# Patient Record
Sex: Male | Born: 1990 | Race: Black or African American | Hispanic: No | Smoking: Never smoker
Health system: Southern US, Community
[De-identification: ages and names within clinical notes are randomized; demographics above are authoritative.]

## PROBLEM LIST (undated history)

## (undated) DIAGNOSIS — I10 Essential (primary) hypertension: Secondary | ICD-10-CM

## (undated) DIAGNOSIS — F419 Anxiety disorder, unspecified: Secondary | ICD-10-CM

## (undated) DIAGNOSIS — Z6834 Body mass index (BMI) 34.0-34.9, adult: Secondary | ICD-10-CM

## (undated) HISTORY — PX: ANTERIOR CRUCIATE LIGAMENT REPAIR: SHX115

## (undated) HISTORY — DX: Body mass index (BMI) 34.0-34.9, adult: Z68.34

## (undated) HISTORY — PX: BACK SURGERY: SHX140

---
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
## 2009-03-09 ENCOUNTER — Encounter: Admission: RE | Admit: 2009-03-09 | Discharge: 2009-03-09 | Payer: Self-pay | Admitting: Neurosurgery

## 2009-03-19 ENCOUNTER — Ambulatory Visit (HOSPITAL_COMMUNITY): Admission: RE | Admit: 2009-03-19 | Discharge: 2009-03-19 | Payer: Self-pay | Admitting: Neurosurgery

## 2009-04-14 ENCOUNTER — Emergency Department (HOSPITAL_COMMUNITY): Admission: EM | Admit: 2009-04-14 | Discharge: 2009-04-14 | Payer: Self-pay | Admitting: Emergency Medicine

## 2009-12-20 ENCOUNTER — Emergency Department (HOSPITAL_COMMUNITY): Admission: EM | Admit: 2009-12-20 | Discharge: 2009-12-20 | Payer: Self-pay | Admitting: Emergency Medicine

## 2010-05-28 ENCOUNTER — Emergency Department (HOSPITAL_COMMUNITY): Admission: EM | Admit: 2010-05-28 | Discharge: 2010-05-28 | Payer: Self-pay | Admitting: Emergency Medicine

## 2010-11-18 LAB — RAPID STREP SCREEN (MED CTR MEBANE ONLY): Streptococcus, Group A Screen (Direct): NEGATIVE

## 2010-11-19 LAB — CBC
HCT: 46.5 % (ref 39.0–52.0)
Hemoglobin: 16 g/dL (ref 13.0–17.0)
MCHC: 34.5 g/dL (ref 30.0–36.0)
MCV: 86.7 fL (ref 78.0–100.0)
Platelets: 246 10*3/uL (ref 150–400)
RBC: 5.36 MIL/uL (ref 4.22–5.81)
RDW: 13 % (ref 11.5–15.5)
WBC: 5.6 10*3/uL (ref 4.0–10.5)

## 2010-12-13 ENCOUNTER — Other Ambulatory Visit: Payer: Self-pay | Admitting: Plastic Surgery

## 2010-12-13 DIAGNOSIS — N62 Hypertrophy of breast: Secondary | ICD-10-CM

## 2010-12-21 ENCOUNTER — Ambulatory Visit
Admission: RE | Admit: 2010-12-21 | Discharge: 2010-12-21 | Disposition: A | Discharge status: Home / Self Care | Payer: BC Managed Care – PPO | Source: Ambulatory Visit | Attending: Plastic Surgery | Admitting: Plastic Surgery

## 2010-12-21 DIAGNOSIS — N62 Hypertrophy of breast: Secondary | ICD-10-CM

## 2010-12-27 NOTE — Op Note (Signed)
NAME:  Brian [name], Brian [name] NO.:  [id number]   MEDICAL RECORD NO.:  [id number]          PATIENT TYPE:  OIB   LOCATION:  3528                         FACILITY:  [facility]   PHYSICIAN:  Coletta Memos, M.D.     DATE OF BIRTH:  1990/08/16   DATE OF PROCEDURE:  03/19/2009  DATE OF DISCHARGE:  03/19/2009                               OPERATIVE REPORT   PREOPERATIVE DIAGNOSES:  1. Displaced disk, left L5-S1.  2. Degenerative disk disease, L4-5 and L5-S1.  3. Lumbar radiculopathy.  4. Lumbar spondylosis without myelopathy.   POSTOPERATIVE DIAGNOSES:  1. Displaced disk, left L5-S1.  2. Degenerative disk disease, L4-5 and L5-S1.  3. Lumbar radiculopathy.  4. Lumbar spondylosis without myelopathy.   PROCEDURE:  1. Left L5-S1 diskectomy without laminectomy.  2. Microdissection.   COMPLICATIONS:  None.   SURGEON:  Coletta Memos, MD   ASSISTANT:  Clydene Fake, MD   INDICATIONS:  Brian [name] is an 20 year old young man who has had a 2-  year documented displaced disk at L5-S1 on the left side.  He has dealt  with the pain until recently when it has become so much worse that he  felt he needed to do something else.  He has had an extensive  conservative treatment during that time.  Nevertheless, it has been  ineffectual and he presents today for operative decompression.   OPERATIVE NOTE:  [name] was brought to the operating room intubated  and placed under general anesthetic.  He was rolled prone onto a Wilson  frame and all pressure points were properly padded.  His back was  prepped and he was draped in a sterile fashion.  I infiltrated 20 mL of  0.5% lidocaine with 1:200,000 strength epinephrine into the lumbar  region.  I then opened the skin with a #10 blade and took the incision  down to the thoracolumbar fascia.  Then using #10 blade, I made a  semicircular opening in the thoracolumbar fascia and retracted that  medially.  I exposed lamina of L5 and S1.  I took  an intraoperative x-  ray to confirm my interlaminar location.  I then proceeded to open the  ligamentum flavum using a 15 blade and Kerrison punch.  I exposed the  thecal sac.  I brought the microscope into the operative field and with  microdissection, I retracted thecal sac medially and exposed what was a  very large disk herniation.  With Dr. Doreen Beam assistance, we then  removed the disk material in a progressive fashion.  I did not scrape  the endplates but did make sure to take down what was a central mound of  disk material.  After decompressing the nerve root knowing that disk  material was still left behind, I then irrigated.  I felt that the  decompression was quite good and that there was no pressure on the S1  root.  I then closed wound in layered fashion with Dr. Doreen Beam  assistance reapproximating the thoracolumbar fascia, subcutaneous, and  subcuticular layers.  I used Dermabond for sterile dressing.  ______________________________  Coletta Memos, M.D.     KC/MEDQ  D:  03/19/2009  T:  03/20/2009  Job:  [id number]

## 2010-12-30 NOTE — Op Note (Signed)
NAME:  Brian [name], Brian [name]                           ACCOUNT NO.:  [id number]   MEDICAL RECORD NO.:  [id number]                   PATIENT TYPE:  INP   LOCATION:  6126                                 FACILITY:  [facility]   PHYSICIAN:  Valetta Fuller, M.D.               DATE OF BIRTH:  09/22/1990   DATE OF PROCEDURE:  DATE OF DISCHARGE:  06/27/2003                                 OPERATIVE REPORT   PREOPERATIVE DIAGNOSIS:  Right testicular torsion.   POSTOPERATIVE DIAGNOSIS:  Right testicular torsion.   PROCEDURE:  Scrotal exploration with bilateral scrotal orchidopexy.   SURGEON:  Valetta Fuller, M.D.   ANESTHESIA:  General endotracheal.   INDICATIONS FOR PROCEDURE:  Brian [name] is an otherwise healthy 20 year old African-  American male. Approximately three hours prior to my evaluation of the  patient, he developed the sudden onset of some right testicular pain. He was  at basketball practice at that time but denied any actual injuries. The pain  was quite abrupt and increased in intensity. He felt that there was right  sided scrotal swelling and he had some mild nausea. He presented to the [facility]  Emergency Room where the ER physician felt the clinical exam and history was  consistent with testicular torsion. We came in immediately to access the  patient. He had indeed had sudden onset of right testicular pain. Clinical  exam indicated a very tender indurated right testicle. We felt this  represented an extremely high likelihood of testicular torsion. He did not  feel that the leg with the Doppler or any other type of imaging studies was  really required and was potentially detrimental. We recommended to the  family that immediate scrotal exploration be performed. We explained to them  that if torsion was found we would attempt a detorsion, access the viability  of the testis and hopefully perform bilateral orchiopexies. Mom and dad were  present and appeared to understand these issues and the  rationale for  immediate exploration.   TECHNIQUE AND FINDINGS:  The patient was brought to the operating room where  he had successful induction of general endotracheal anesthesia. He was  placed in a supine position and prepped and draped in the usual manner. A  standard median raphe incision was made in the scrotum. Attention was first  turned to the right hemiscrotum compartment where the tunica vaginalis was  opened. There appeared to be a small amount of reactive straw colored  hydrocele fluid. Upon entering the tunica vaginalis, one could see a very  dusky purple colored testis. Upon removing the testis, there was  approximately 540 degrees of torsion. The testicle was detorsed and  immediately became very pink and viable in appearance. We removed the  appendages from the epididymis and testis and then turned our attention  towards the left hemiscrotum. The right testis was wrapped in saline gauze.  The left scrotal compartment  was opened and that testis was completely as  expected. A three point fixation was performed with some PDS suture. That  testis was then returned to the hemiscrotum. Attention was then turned to  his right testicle which again looked quite viable and pink. It continued to  be a little bit indurated and firm consistent with some venous congestion  but there was no question about testicular viability in my opinion. A three  point fixation  procedure was also done on that side. Spermatic cord blocks were done with  Marcaine. The median raphe was closed with a running Vicryl suture and the  skin was closed with interrupted Vicryl. The patient appeared to tolerate  the procedure well and he was brought to the recovery room in stable  condition.                                               Valetta Fuller, M.D.    DSG/MEDQ  D:  06/26/2003  T:  06/27/2003  Job:  [id number]

## 2010-12-30 NOTE — Op Note (Signed)
NAME:  Brian [name], RADLE NO.:  [id number]   MEDICAL RECORD NO.:  [id number]          PATIENT TYPE:  AMB   LOCATION:  DSC                          FACILITY:  [facility]   PHYSICIAN:  Loreta Ave, M.D. DATE OF BIRTH:  03-27-1991   DATE OF PROCEDURE:  03/22/2006  DATE OF DISCHARGE:                                 OPERATIVE REPORT   POSTOPERATIVE DIAGNOSIS:  Right knee lateral meniscus tear.   POSTOPERATIVE DIAGNOSIS:  Right knee lateral meniscus tear.   OPERATIVE PROCEDURE:  Right knee examination under anesthesia, arthroscopy  with extensive lateral meniscectomy.   SURGEON:  Loreta Ave, M.D.   ASSISTANT:  Genene Churn. Denton Meek.   ANESTHESIA:  General.   BLOOD LOSS:  Minimal.   TOURNIQUET:  Not employed.   SPECIMENS:  None.   CULTURES:  None.   COMPLICATIONS:  None.   DRESSINGS:  Soft compressive.   DESCRIPTION OF PROCEDURE:  Patient brought to the operating room and after  adequate anesthesia had been obtained, both knees examined.  On the right  full motion and stable ligaments, negative Lachman, negative drawer.  Soft  tissue swelling almost looking like a meniscal cyst at the lateral meniscus.  Positive McMurray.  Tourniquet and leg holder applied.  Leg prepped and  draped in the usual sterile fashion.  Three portals created, one  superolateral, one either medial and lateral parapatellar.  __________  inspected.  Good patellofemoral tracking.  Articular cartridge intact  throughout.  Cruciate ligaments intact.  Medial meniscus, medial compartment  looked good without an appreciable tear.  The lateral meniscus marked  complex tearing, two radial tears.  One at the junction of the medial and  anterior third and the other at the junction of the medial and posterior  third.  Marked complex tearing at both sites.  Not a reputable lesions.  Most of the anterior half and part of the posterior half removed to  facilitate removal of all of the marked  complex tearing.  Able to establish  some of the posterior third.  Hemostasis with cautery, as the tear went out  towards the vascular structures at the margin.  In completion, all  exams  showed the  fragments removed.  Instruments were further removed.  Portals and knee  injected with Marcaine.  Portals closed with 4-0 nylon.  Sterile compressive  dressing applied.  Anesthesia reversed.  Brought to recovery room.  Tolerated the surgery well, no complications.      Loreta Ave, M.D.  Electronically Signed     DFM/MEDQ  D:  03/22/2006  T:  03/23/2006  Job:  [id number]

## 2011-02-05 ENCOUNTER — Emergency Department (HOSPITAL_COMMUNITY)
Admission: EM | Admit: 2011-02-05 | Discharge: 2011-02-05 | Disposition: A | Discharge status: Home / Self Care | Payer: Worker's Compensation | Attending: Emergency Medicine | Admitting: Emergency Medicine

## 2011-02-05 DIAGNOSIS — M79609 Pain in unspecified limb: Secondary | ICD-10-CM | POA: Insufficient documentation

## 2011-02-05 DIAGNOSIS — S61209A Unspecified open wound of unspecified finger without damage to nail, initial encounter: Secondary | ICD-10-CM | POA: Insufficient documentation

## 2011-02-05 DIAGNOSIS — W268XXA Contact with other sharp object(s), not elsewhere classified, initial encounter: Secondary | ICD-10-CM | POA: Insufficient documentation

## 2011-02-07 ENCOUNTER — Emergency Department (HOSPITAL_COMMUNITY)
Admission: EM | Admit: 2011-02-07 | Discharge: 2011-02-07 | Disposition: A | Discharge status: Home / Self Care | Payer: Worker's Compensation | Attending: Emergency Medicine | Admitting: Emergency Medicine

## 2011-02-07 ENCOUNTER — Inpatient Hospital Stay (INDEPENDENT_AMBULATORY_CARE_PROVIDER_SITE_OTHER)
Admission: RE | Admit: 2011-02-07 | Discharge: 2011-02-07 | Disposition: A | Discharge status: Home / Self Care | Payer: Worker's Compensation | Source: Ambulatory Visit | Attending: Emergency Medicine | Admitting: Emergency Medicine

## 2011-02-07 ENCOUNTER — Ambulatory Visit (INDEPENDENT_AMBULATORY_CARE_PROVIDER_SITE_OTHER): Payer: BC Managed Care – PPO

## 2011-02-07 DIAGNOSIS — Z09 Encounter for follow-up examination after completed treatment for conditions other than malignant neoplasm: Secondary | ICD-10-CM | POA: Insufficient documentation

## 2011-02-07 DIAGNOSIS — M79609 Pain in unspecified limb: Secondary | ICD-10-CM | POA: Insufficient documentation

## 2011-02-07 DIAGNOSIS — M7989 Other specified soft tissue disorders: Secondary | ICD-10-CM | POA: Insufficient documentation

## 2011-02-07 DIAGNOSIS — S61209A Unspecified open wound of unspecified finger without damage to nail, initial encounter: Secondary | ICD-10-CM

## 2011-02-09 NOTE — Consult Note (Signed)
  NAME:  [name], BAIL NO.:  [id number]  MEDICAL RECORD NO.:  [id number]  LOCATION:  URG                          FACILITY:  [facility]  PHYSICIAN:  Dionne Ano. Meghan Tiemann, M.D.DATE OF BIRTH:  [birth year]  DATE OF CONSULTATION: DATE OF DISCHARGE:                                CONSULTATION   I had pleasure to see Brian [name] at 10 p.m. in the [facility] Emergency Room for evaluation of his hand.  The patient states he has had significant pain after a duct work injury while on the job at [employer].  The patient cut his hand.  He was given a tetanus shot. Laceration was sutured.  He presented for evaluation today in emergent care.  There was question of infection and I was asked see him.  He is 20 years of age, here with his mother, alert and oriented.  ALLERGIES:  AUGMENTIN.  MEDICATIONS:  None.  PAST SURGICAL HISTORY:  Testicular torsion surgery, ACL surgery, back surgery, thumb surgery.  PAST MEDICAL HISTORY:  None.  He is a nonsmoker.  PHYSICAL EXAMINATION:  GENERAL:  On exam, pleasant male, alert and oriented, no distress. VITAL SIGNS:  The patient has full sensation to the elbow, wrist and forearm.  Right index finger has a laceration of the DIP joint.  The FDP is intact.  There is no gross Kanavel signs.  He has some mild tenderness over the A1 pulley where the injection was performed but does not have any evidence of instability, ascending infection, cellulitis or purulent flexor tenosynovitis. HEENT:  Within normal limits. CHEST:  Clear. ABDOMEN:  Nontender. EXTREMITIES:  Lower extremity examination is benign.  I have reviewed this in length and findings.  IMPRESSION:  Status post laceration, sutured and without signs of ascending infection, now greater than 48 hours out.  PLAN:  I will place him on Keflex as well as Bactrim, Keflex 500 mg one p.o. q.i.d. x7 days, Bactrim DS one p.o. b.i.d. x14 days and I am going to have him have Vicodin for pain and  continue on out of work status.  I have asked him to call the office to that I can see him in 48 hours for wound check.  If he has any problems, I have given him my card and asked him to notify me immediately.  It has been absolute pleasure to see him today and treat him.  We look forward to be participating in his postop recovery.     Dionne Ano. Amanda Pea, M.D.     Nash Mantis  D:  02/07/2011  T:  02/07/2011  Job:  [id number]  Electronically Signed by Dominica Severin M.D. on 02/09/2011 81:19:14 PM

## 2011-10-13 ENCOUNTER — Other Ambulatory Visit: Payer: Self-pay | Admitting: Neurosurgery

## 2011-10-13 DIAGNOSIS — M549 Dorsalgia, unspecified: Secondary | ICD-10-CM

## 2011-10-20 ENCOUNTER — Ambulatory Visit
Admission: RE | Admit: 2011-10-20 | Discharge: 2011-10-20 | Disposition: A | Discharge status: Home / Self Care | Payer: BC Managed Care – PPO | Source: Ambulatory Visit | Attending: Neurosurgery | Admitting: Neurosurgery

## 2011-10-20 DIAGNOSIS — M549 Dorsalgia, unspecified: Secondary | ICD-10-CM

## 2011-10-20 MED ORDER — GADOBENATE DIMEGLUMINE 529 MG/ML IV SOLN
20.0000 mL | Freq: Once | INTRAVENOUS | Status: AC | PRN
  Administered 2011-10-20: 20 mL via INTRAVENOUS

## 2011-11-01 ENCOUNTER — Ambulatory Visit (INDEPENDENT_AMBULATORY_CARE_PROVIDER_SITE_OTHER): Payer: BC Managed Care – PPO | Admitting: Family Medicine

## 2011-11-01 VITALS — BP 121/84 | HR 79 | Temp 98.0°F | Resp 16 | Ht 72.0 in | Wt 215.0 lb

## 2011-11-01 DIAGNOSIS — R1110 Vomiting, unspecified: Secondary | ICD-10-CM

## 2011-11-01 DIAGNOSIS — R112 Nausea with vomiting, unspecified: Secondary | ICD-10-CM

## 2011-11-01 DIAGNOSIS — M519 Unspecified thoracic, thoracolumbar and lumbosacral intervertebral disc disorder: Secondary | ICD-10-CM | POA: Insufficient documentation

## 2011-11-01 LAB — POCT CBC
Granulocyte percent: 85.1 %G — AB (ref 37–80)
HCT, POC: 46 % (ref 43.5–53.7)
Hemoglobin: 16 g/dL (ref 14.1–18.1)
Lymph, poc: 0.9 (ref 0.6–3.4)
MCH, POC: 30.7 pg (ref 27–31.2)
MCHC: 34.8 g/dL (ref 31.8–35.4)
MCV: 88.2 fL (ref 80–97)
MID (cbc): 0.6 (ref 0–0.9)
MPV: 10.3 fL (ref 0–99.8)
POC Granulocyte: 8.4 — AB (ref 2–6.9)
POC LYMPH PERCENT: 8.9 %L — AB (ref 10–50)
POC MID %: 6 %M (ref 0–12)
Platelet Count, POC: 215 10*3/uL (ref 142–424)
RBC: 5.21 M/uL (ref 4.69–6.13)
RDW, POC: 13.8 %
WBC: 9.9 10*3/uL (ref 4.6–10.2)

## 2011-11-01 MED ORDER — PROMETHAZINE HCL 25 MG/ML IJ SOLN
25.0000 mg | Freq: Four times a day (QID) | INTRAMUSCULAR | Status: DC | PRN
  Administered 2011-11-01: 25 mg via INTRAMUSCULAR

## 2011-11-01 MED ORDER — ONDANSETRON 4 MG PO TBDP
4.0000 mg | ORAL_TABLET | Freq: Once | ORAL | Status: AC
  Administered 2011-11-01: 4 mg via ORAL

## 2011-11-01 MED ORDER — ONDANSETRON 8 MG PO TBDP: 8.0000 mg | ORAL_TABLET | Freq: Three times a day (TID) | ORAL | Status: AC | PRN

## 2011-11-01 MED ORDER — SODIUM CHLORIDE 0.9 % IV SOLN: INTRAVENOUS | Status: DC

## 2011-11-01 NOTE — Progress Notes (Signed)
  Subjective:    Patient ID: Brian [name], male    DOB: 06-05-1991, 21 y.o.   MRN: [id number]  HPI    Review of Systems     Objective:   Physical Exam    1545:  IV 20 gauge placed in Right antecubital space and NS hung a wide open rate.  Blood drawn and sent to lab.  Lindaann Slough, PA-C    Assessment & Plan:

## 2011-11-01 NOTE — Progress Notes (Signed)
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  Complains of sharp stomach cramps as well.    O:  Appears acutely ill and fatigued, lightheaded when standing. HEENT:  No icterus, normal tm's, dry mm, no erythema in throat, no epistaxis Neck: supple no adenopathy Chest:  Clear Heart: rapid, no murmur Abdomen:  Hyperactive BS, mild diffuse tenderness with deep palpation, no guarding or rebound, no HSM Skin:  Cool extremities, dry, no rashes Neuro:  Normal mental status, normal cranial nerves, moves 4 extremities  BP 142/82  P 73 lying BP 143/91 P 83 standing.  A:  Early dehydration, ongoing fluid losses

## 2011-11-01 NOTE — Patient Instructions (Signed)
Nausea and Vomiting  Nausea is a sick feeling that often comes before throwing up (vomiting). Vomiting is a reflex where stomach contents come out of your mouth. Vomiting can cause severe loss of body fluids (dehydration). Children and elderly adults can become dehydrated quickly, especially if they also have diarrhea. Nausea and vomiting are symptoms of a condition or disease. It is important to find the cause of your symptoms.  CAUSES    Direct irritation of the stomach lining. This irritation can result from increased acid production (gastroesophageal reflux disease), infection, food poisoning, taking certain medicines (such as nonsteroidal anti-inflammatory drugs), alcohol use, or tobacco use.   Signals from the brain.These signals could be caused by a headache, heat exposure, an inner ear disturbance, increased pressure in the brain from injury, infection, a tumor, or a concussion, pain, emotional stimulus, or metabolic problems.   An obstruction in the gastrointestinal tract (bowel obstruction).   Illnesses such as diabetes, hepatitis, gallbladder problems, appendicitis, kidney problems, cancer, sepsis, atypical symptoms of a heart attack, or eating disorders.   Medical treatments such as chemotherapy and radiation.   Receiving medicine that makes you sleep (general anesthetic) during surgery.  DIAGNOSIS  Your caregiver may ask for tests to be done if the problems do not improve after a few days. Tests may also be done if symptoms are severe or if the reason for the nausea and vomiting is not clear. Tests may include:   Urine tests.   Blood tests.   Stool tests.   Cultures (to look for evidence of infection).   X-rays or other imaging studies.  Test results can help your caregiver make decisions about treatment or the need for additional tests.  TREATMENT  You need to stay well hydrated. Drink frequently but in small amounts.You may wish to drink water, sports drinks, clear broth, or eat frozen  ice pops or gelatin dessert to help stay hydrated.When you eat, eating slowly may help prevent nausea.There are also some antinausea medicines that may help prevent nausea.  HOME CARE INSTRUCTIONS    Take all medicine as directed by your caregiver.   If you do not have an appetite, do not force yourself to eat. However, you must continue to drink fluids.   If you have an appetite, eat a normal diet unless your caregiver tells you differently.   Eat a variety of complex carbohydrates (rice, wheat, potatoes, bread), lean meats, yogurt, fruits, and vegetables.   Avoid high-fat foods because they are more difficult to digest.   Drink enough water and fluids to keep your urine clear or pale yellow.   If you are dehydrated, ask your caregiver for specific rehydration instructions. Signs of dehydration may include:   Severe thirst.   Dry lips and mouth.   Dizziness.   Dark urine.   Decreasing urine frequency and amount.   Confusion.   Rapid breathing or pulse.  SEEK IMMEDIATE MEDICAL CARE IF:    You have blood or brown flecks (like coffee grounds) in your vomit.   You have black or bloody stools.   You have a severe headache or stiff neck.   You are confused.   You have severe abdominal pain.   You have chest pain or trouble breathing.   You do not urinate at least once every 8 hours.   You develop cold or clammy skin.   You continue to vomit for longer than 24 to 48 hours.   You have a fever.  MAKE SURE YOU:      Understand these instructions.   Will watch your condition.   Will get help right away if you are not doing well or get worse.  Document Released: 07/31/2005 Document Revised: 07/20/2011 Document Reviewed: 12/28/2010  Exit[employer] Patient Information 2012 [employer].

## 2011-11-02 LAB — COMPREHENSIVE METABOLIC PANEL
ALT: 41 U/L (ref 0–53)
AST: 89 U/L — ABNORMAL HIGH (ref 0–37)
Albumin: 5.2 g/dL (ref 3.5–5.2)
Alkaline Phosphatase: 91 U/L (ref 39–117)
BUN: 11 mg/dL (ref 6–23)
CO2: 26 mEq/L (ref 19–32)
Calcium: 9.8 mg/dL (ref 8.4–10.5)
Chloride: 104 mEq/L (ref 96–112)
Creat: 0.91 mg/dL (ref 0.50–1.35)
Glucose, Bld: 84 mg/dL (ref 70–99)
Potassium: 4.4 mEq/L (ref 3.5–5.3)
Sodium: 141 mEq/L (ref 135–145)
Total Bilirubin: 0.7 mg/dL (ref 0.3–1.2)
Total Protein: 7.7 g/dL (ref 6.0–8.3)

## 2011-12-03 ENCOUNTER — Ambulatory Visit (INDEPENDENT_AMBULATORY_CARE_PROVIDER_SITE_OTHER): Payer: BC Managed Care – PPO | Admitting: Family Medicine

## 2011-12-03 ENCOUNTER — Encounter: Payer: Self-pay | Admitting: Physician Assistant

## 2011-12-03 ENCOUNTER — Encounter: Payer: Self-pay | Admitting: Family Medicine

## 2011-12-03 VITALS — BP 129/85 | HR 91 | Temp 98.0°F | Resp 12 | Ht 74.0 in | Wt 215.0 lb

## 2011-12-03 DIAGNOSIS — G8918 Other acute postprocedural pain: Secondary | ICD-10-CM

## 2011-12-03 DIAGNOSIS — M519 Unspecified thoracic, thoracolumbar and lumbosacral intervertebral disc disorder: Secondary | ICD-10-CM

## 2011-12-03 DIAGNOSIS — M549 Dorsalgia, unspecified: Secondary | ICD-10-CM

## 2011-12-03 LAB — POCT CBC
Granulocyte percent: 62.3 %G (ref 37–80)
HCT, POC: 47.4 % (ref 43.5–53.7)
Hemoglobin: 15.5 g/dL (ref 14.1–18.1)
Lymph, poc: 2.5 (ref 0.6–3.4)
MCH, POC: 29 pg (ref 27–31.2)
MCHC: 32.7 g/dL (ref 31.8–35.4)
MCV: 88.7 fL (ref 80–97)
MID (cbc): 0.5 (ref 0–0.9)
MPV: 9.5 fL (ref 0–99.8)
POC Granulocyte: 5 (ref 2–6.9)
POC LYMPH PERCENT: 31.4 %L (ref 10–50)
POC MID %: 6.3 %M (ref 0–12)
Platelet Count, POC: 330 10*3/uL (ref 142–424)
RBC: 5.34 M/uL (ref 4.69–6.13)
RDW, POC: 13.4 %
WBC: 8 10*3/uL (ref 4.6–10.2)

## 2011-12-03 MED ORDER — OXYCODONE-ACETAMINOPHEN 5-325 MG PO TABS: 1.0000 | ORAL_TABLET | Freq: Four times a day (QID) | ORAL | Status: AC | PRN

## 2011-12-03 NOTE — Patient Instructions (Addendum)
Normal CBC. He needs to follow up with her back surgeon tomorrow. Take Ibuprofen 800 mg 3 times daily,  Percocet 5 (20) for severe pain.

## 2011-12-03 NOTE — Progress Notes (Signed)
Subjective: 21 year old man who goes to [employer]. He recently had his second disc surgeries. The first one was when he was about 21 years old. At this time he had a right herniated disc at L4-5. The surgeon was Dr. Barbaraann Barthel. He denied day since his surgery he continues to have a great deal of pain in his back and leg. He saw his doctor earlier in the week. A CBC was done at that time but he does not know the result of it. He is concerned about the possibility of infection causing his pain to be worse. His girlfriend, who is a Engineer, civil (consulting), has dressed him with a wet to dry dressing. He is taking hydrocodone 5/325 for pain.  Objective: Recent surgical wound down his midline of the low back. The wound looks clean and dry. I do not see any pus coming out of it. He is tender in the low back. Straight leg raising test is positive at about 10-15 on the right and 20-25 on the left he has spasms of pain.  Assessment: Lumbar disc disease, with recent surgery and postop pain.  Plan: Check CBC. It is markedly elevated would need to contact his doctor, otherwise he will need to communicate with them in the morning.  Results for orders placed in visit on 12/03/11  POCT CBC      Component Value Range   WBC 8.0  4.6 - 10.2 (K/uL)   Lymph, poc 2.5  0.6 - 3.4    POC LYMPH PERCENT 31.4  10 - 50 (%L)   MID (cbc) 0.5  0 - 0.9    POC MID % 6.3  0 - 12 (%M)   POC Granulocyte 5.0  2 - 6.9    Granulocyte percent 62.3  37 - 80 (%G)   RBC 5.34  4.69 - 6.13 (M/uL)   Hemoglobin 15.5  14.1 - 18.1 (g/dL)   HCT, POC 96.0  45.4 - 53.7 (%)   MCV 88.7  80 - 97 (fL)   MCH, POC 29.0  27 - 31.2 (pg)   MCHC 32.7  31.8 - 35.4 (g/dL)   RDW, POC 09.8     Platelet Count, POC 330  142 - 424 (K/uL)   MPV 9.5  0 - 99.8 (fL)   Back pain postop  Follow up with his back surgeon

## 2012-01-06 ENCOUNTER — Emergency Department (HOSPITAL_COMMUNITY)
Admission: EM | Admit: 2012-01-06 | Discharge: 2012-01-06 | Disposition: A | Discharge status: Home / Self Care | Payer: BC Managed Care – PPO | Source: Home / Self Care | Attending: Emergency Medicine | Admitting: Emergency Medicine

## 2012-01-06 ENCOUNTER — Encounter (HOSPITAL_COMMUNITY): Payer: Self-pay | Admitting: *Deleted

## 2012-01-06 ENCOUNTER — Emergency Department (INDEPENDENT_AMBULATORY_CARE_PROVIDER_SITE_OTHER): Payer: BC Managed Care – PPO

## 2012-01-06 DIAGNOSIS — J40 Bronchitis, not specified as acute or chronic: Secondary | ICD-10-CM

## 2012-01-06 HISTORY — DX: Anxiety disorder, unspecified: F41.9

## 2012-01-06 MED ORDER — PREDNISONE 20 MG PO TABS: 40.0000 mg | ORAL_TABLET | Freq: Every day | ORAL | Status: DC

## 2012-01-06 MED ORDER — DOXYCYCLINE HYCLATE 100 MG PO CAPS: 100.0000 mg | ORAL_CAPSULE | Freq: Two times a day (BID) | ORAL | Status: DC

## 2012-01-06 MED ORDER — ALBUTEROL SULFATE HFA 108 (90 BASE) MCG/ACT IN AERS: 1.0000 | INHALATION_SPRAY | Freq: Four times a day (QID) | RESPIRATORY_TRACT | Status: DC | PRN

## 2012-01-06 NOTE — ED Notes (Addendum)
Pt with c/o cough/congestion/body aches x 3 days - taking otc meds without relief - fever 3 days ago

## 2012-01-06 NOTE — ED Provider Notes (Signed)
History     CSN: [id number]  Arrival date & time 01/06/12  1308   First MD Initiated Contact with Patient 01/06/12 1403      Chief Complaint  Patient presents with  . Cough  . Nasal Congestion    (Consider location/radiation/quality/duration/timing/severity/associated sxs/prior treatment) HPI Comments: Its been about 3 days, with a very strong wet cough and phlegm,  congested of my sinuses and my chest", did have some fevers, mostly the first 2 days, that has gotten better, just feels like im not breathing well..."  Patient is a 21 y.o. male presenting with cough. The history is provided by the patient.  Cough This is a new problem. The current episode started more than 2 days ago. The problem occurs constantly. The cough is productive of sputum. Associated symptoms include rhinorrhea and shortness of breath. Pertinent negatives include no chest pain, no chills, no ear pain, no wheezing and no eye redness. The treatment provided no relief. He is not a smoker. His past medical history does not include asthma.    Past Medical History  Diagnosis Date  . Anxiety     Past Surgical History  Procedure Date  . Anterior cruciate ligament repair   . Back surgery     History reviewed. No pertinent family history.  History  Substance Use Topics  . Smoking status: Never Smoker   . Smokeless tobacco: Not on file  . Alcohol Use: Yes      Review of Systems  Constitutional: Positive for fever and activity change. Negative for chills.  HENT: Positive for congestion and rhinorrhea. Negative for hearing loss and ear pain.   Eyes: Negative for redness.  Respiratory: Positive for cough and shortness of breath. Negative for chest tightness, wheezing and stridor.   Cardiovascular: Negative for chest pain.  Skin: Negative for rash.    Allergies  Review of patient's allergies indicates no known allergies.  Home Medications   Current Outpatient Rx  Name Route Sig Dispense Refill  .  DEXTROMETHORPHAN POLISTIREX ER 30 MG/5ML PO LQCR Oral Take 60 mg by mouth as needed.    Marland Kitchen DIAZEPAM 10 MG PO TABS Oral Take 10 mg by mouth every 8 (eight) hours as needed.    Marland Kitchen DIPHENHYDRAMINE HCL 25 MG PO TABS Oral Take 25 mg by mouth every 6 (six) hours as needed.    . ALBUTEROL SULFATE HFA 108 (90 BASE) MCG/ACT IN AERS Inhalation Inhale 1-2 puffs into the lungs every 6 (six) hours as needed for wheezing or shortness of breath. 1 Inhaler 0  . DOXYCYCLINE HYCLATE 100 MG PO CAPS Oral Take 1 capsule (100 mg total) by mouth 2 (two) times daily. 20 capsule 0  . HYDROCODONE-ACETAMINOPHEN 5-500 MG PO CAPS Oral Take 1 capsule by mouth every 6 (six) hours as needed.    Marland Kitchen PREDNISONE 20 MG PO TABS Oral Take 2 tablets (40 mg total) by mouth daily. 2 tablets daily for 5 days 10 tablet 0  . TRAMADOL HCL 50 MG PO TABS Oral Take 50 mg by mouth every 6 (six) hours as needed.      BP 137/103  Pulse 94  Temp(Src) 98.3 F (36.8 C) (Oral)  Resp 18  SpO2 99%  Physical Exam  Nursing note and vitals reviewed. Constitutional: He appears well-developed and well-nourished.  HENT:  Head: Normocephalic.  Mouth/Throat: Uvula is midline. Posterior oropharyngeal erythema present. No oropharyngeal exudate.  Eyes: Conjunctivae are normal.  Neck: Neck supple. No JVD present.  Cardiovascular: Normal rate and  regular rhythm.   Pulmonary/Chest: Effort normal. No accessory muscle usage. Not tachypneic. No respiratory distress. He has decreased breath sounds. He has no wheezes. He has rhonchi. He has no rales.  Lymphadenopathy:    He has no cervical adenopathy.    ED Course  Procedures (including critical care time)  Labs Reviewed - No data to display Dg Chest 2 View  01/06/2012  *RADIOLOGY REPORT*  Clinical Data: Cough, nasal congestion.  CHEST - 2 VIEW  Comparison: 12/20/2009  Findings: Cardiomediastinal silhouette is within normal limits. The lungs are free of focal consolidations and pleural effusions. Bony  structures have a normal appearance.  IMPRESSION: Negative exam.  Original Report Authenticated By: Patterson Hammersmith, M.D.     1. Bronchitis       MDM  Bronchitis, patient encouraged to continue taking OTC, decongestant and antihistamine.Jimmie Molly, MD 01/06/12 [phone]

## 2012-01-08 ENCOUNTER — Emergency Department (HOSPITAL_COMMUNITY)
Admission: EM | Admit: 2012-01-08 | Discharge: 2012-01-08 | Disposition: A | Discharge status: Home / Self Care | Payer: BC Managed Care – PPO | Attending: Emergency Medicine | Admitting: Emergency Medicine

## 2012-01-08 ENCOUNTER — Encounter (HOSPITAL_COMMUNITY): Payer: Self-pay | Admitting: Emergency Medicine

## 2012-01-08 DIAGNOSIS — J069 Acute upper respiratory infection, unspecified: Secondary | ICD-10-CM | POA: Insufficient documentation

## 2012-01-08 MED ORDER — GUAIFENESIN-CODEINE 100-10 MG/5ML PO SYRP: 5.0000 mL | ORAL_SOLUTION | Freq: Three times a day (TID) | ORAL | Status: AC | PRN

## 2012-01-08 NOTE — ED Provider Notes (Signed)
Medical screening examination/treatment/procedure(s) were performed by non-physician practitioner and as supervising physician I was immediately available for consultation/collaboration.   Gwyneth Sprout, MD 01/08/12 2141

## 2012-01-08 NOTE — Discharge Instructions (Signed)
Brian [name] you can add claritin to your meds.  Also use the inhaler 4 times a day (no more).  Use tylenol for aches and pains.  Continue the antibiotics and prednisone.  The cough med will make you sleepy so take it at night.  Follow up with your pcp if not better in a couple days or return for n/v/fever.    Antibiotic Nonuse  Your caregiver felt that the infection or problem was not one that would be helped with an antibiotic. Infections may be caused by viruses or bacteria. Only a caregiver can tell which one of these is the likely cause of an illness. A cold is the most common cause of infection in both adults and children. A cold is a virus. Antibiotic treatment will have no effect on a viral infection. Viruses can lead to many lost days of work caring for sick children and many missed days of school. Children may catch as many as 10 "colds" or "flus" per year during which they can be tearful, cranky, and uncomfortable. The goal of treating a virus is aimed at keeping the ill person comfortable. Antibiotics are medications used to help the body fight bacterial infections. There are relatively few types of bacteria that cause infections but there are hundreds of viruses. While both viruses and bacteria cause infection they are very different types of germs. A viral infection will typically go away by itself within 7 to 10 days. Bacterial infections may spread or get worse without antibiotic treatment. Examples of bacterial infections are:  Sore throats (like strep throat or tonsillitis).   Infection in the lung (pneumonia).   Ear and skin infections.  Examples of viral infections are:  Colds or flus.   Most coughs and bronchitis.   Sore throats not caused by Strep.   Runny noses.  It is often best not to take an antibiotic when a viral infection is the cause of the problem. Antibiotics can kill off the helpful bacteria that we have inside our body and allow harmful bacteria to start growing.  Antibiotics can cause side effects such as allergies, nausea, and diarrhea without helping to improve the symptoms of the viral infection. Additionally, repeated uses of antibiotics can cause bacteria inside of our body to become resistant. That resistance can be passed onto harmful bacterial. The next time you have an infection it may be harder to treat if antibiotics are used when they are not needed. Not treating with antibiotics allows our own immune system to develop and take care of infections more efficiently. Also, antibiotics will work better for [place] when they are prescribed for bacterial infections. Treatments for a child that is ill may include:  Give extra fluids throughout the day to stay hydrated.   Get plenty of rest.   Only give your child over-the-counter or prescription medicines for pain, discomfort, or fever as directed by your caregiver.   The use of a cool mist humidifier may help stuffy noses.   Cold medications if suggested by your caregiver.  Your caregiver may decide to start you on an antibiotic if:  The problem you were seen for today continues for a longer length of time than expected.   You develop a secondary bacterial infection.  SEEK MEDICAL CARE IF:  Fever lasts longer than 5 days.   Symptoms continue to get worse after 5 to 7 days or become severe.   Difficulty in breathing develops.   Signs of dehydration develop (poor drinking, rare urinating, dark  colored urine).   Changes in behavior or worsening tiredness (listlessness or lethargy).  Document Released: 10/09/2001 Document Revised: 07/20/2011 Document Reviewed: 04/07/2009 [facility] Patient Information 2012 [place], [place].

## 2012-01-08 NOTE — ED Provider Notes (Signed)
History     CSN: [id number]  Arrival date & time 01/08/12  1314   First MD Initiated Contact with Patient 01/08/12 1351      Chief Complaint  Patient presents with  . URI  . Cough    (Consider location/radiation/quality/duration/timing/severity/associated sxs/prior treatment) Patient is a 21 y.o. male presenting with URI and cough. The history is provided by the patient and a relative. No language interpreter was used.  URI The primary symptoms include fatigue, headaches, cough and myalgias. Primary symptoms do not include fever, ear pain, sore throat, swollen glands, wheezing, abdominal pain, nausea or vomiting. The current episode started 3 to 5 days ago. This is a new problem. The problem has been gradually worsening.  The headache is not associated with weakness.  The myalgias are not associated with weakness.  Symptoms associated with the illness include chills, sinus pressure, congestion and rhinorrhea. The illness is not associated with plugged ear sensation or facial pain.  Cough Associated symptoms include chills, headaches, rhinorrhea and myalgias. Pertinent negatives include no ear pain, no sore throat and no wheezing.  URI x 5 days.  Went to urgent care 2 days ago and treated for bronchitis by Dr. Ladon Applebaum with prednisone, inhaler, and doxycycline. No improvement.  Afebrile no acute distress.   Past Medical History  Diagnosis Date  . Anxiety     Past Surgical History  Procedure Date  . Anterior cruciate ligament repair   . Back surgery     History reviewed. No pertinent family history.  History  Substance Use Topics  . Smoking status: Never Smoker   . Smokeless tobacco: Not on file  . Alcohol Use: Yes      Review of Systems  Constitutional: Positive for chills and fatigue. Negative for fever.  HENT: Positive for congestion, rhinorrhea, trouble swallowing and sinus pressure. Negative for ear pain, sore throat, neck pain and ear discharge.   Eyes: Negative.     Respiratory: Positive for cough. Negative for wheezing.   Cardiovascular: Negative.   Gastrointestinal: Negative.  Negative for nausea, vomiting, abdominal pain and blood in stool.  Musculoskeletal: Positive for myalgias.  Neurological: Positive for headaches. Negative for dizziness, weakness and light-headedness.  Psychiatric/Behavioral: Negative.   All other systems reviewed and are negative.    Allergies  Review of patient's allergies indicates no known allergies.  Home Medications  No current outpatient prescriptions on file.  BP 133/89  Pulse 113  Temp(Src) 98.2 F (36.8 C) (Oral)  Resp 16  Wt 215 lb (97.523 kg)  SpO2 98%  Physical Exam  Nursing note and vitals reviewed. Constitutional: He is oriented to person, place, and time. He appears well-developed and well-nourished.  HENT:  Head: Normocephalic.  Right Ear: External ear normal.  Left Ear: External ear normal.  Mouth/Throat: Oropharynx is clear and moist.       Post nasal drip  Eyes: Conjunctivae and EOM are normal. Pupils are equal, round, and reactive to light.  Neck: Normal range of motion. Neck supple.  Cardiovascular: Normal rate.   Pulmonary/Chest: Effort normal and breath sounds normal. No respiratory distress. He has no wheezes. He has no rales.  Abdominal: Soft.  Musculoskeletal: Normal range of motion.  Neurological: He is alert and oriented to person, place, and time. No cranial nerve deficit.  Skin: Skin is warm and dry.  Psychiatric: He has a normal mood and affect.    ED Course  Procedures (including critical care time)  Labs Reviewed - No data to display Dg  Chest 2 View  01/06/2012  *RADIOLOGY REPORT*  Clinical Data: Cough, nasal congestion.  CHEST - 2 VIEW  Comparison: 12/20/2009  Findings: Cardiomediastinal silhouette is within normal limits. The lungs are free of focal consolidations and pleural effusions. Bony structures have a normal appearance.  IMPRESSION: Negative exam.  Original  Report Authenticated By: Patterson Hammersmith, M.D.     No diagnosis found.    MDM  URI continue doxycycline, prednisone and increase inhaler to 4 times a day x 2 days.  Add robitussin ac and claritin/benadryl.  Follow up with pcp from list as needed.         Remi Haggard, NP 01/08/12 2133

## 2012-01-08 NOTE — ED Notes (Signed)
Pt states dx with bronchitis Saturday and started on albuterol, prednisone, and doxycycline. States he is not feeling better. Gf at bedside sayes pt has not been drinking a lot or taking a decongestant.

## 2012-01-08 NOTE — ED Notes (Signed)
Pt c/o head congestion and cough x 5 days; pt sts seen at [facility] and diagnosed with bronchitis and started on meds but not feeling better; pt c/o head congestion

## 2012-03-14 ENCOUNTER — Ambulatory Visit (INDEPENDENT_AMBULATORY_CARE_PROVIDER_SITE_OTHER): Payer: BC Managed Care – PPO | Admitting: Family Medicine

## 2012-03-14 VITALS — BP 156/84 | HR 88 | Temp 98.1°F | Resp 16 | Ht 72.0 in | Wt 210.0 lb

## 2012-03-14 DIAGNOSIS — F419 Anxiety disorder, unspecified: Secondary | ICD-10-CM

## 2012-03-14 DIAGNOSIS — F411 Generalized anxiety disorder: Secondary | ICD-10-CM

## 2012-03-14 DIAGNOSIS — J029 Acute pharyngitis, unspecified: Secondary | ICD-10-CM

## 2012-03-14 DIAGNOSIS — R5381 Other malaise: Secondary | ICD-10-CM

## 2012-03-14 DIAGNOSIS — R1011 Right upper quadrant pain: Secondary | ICD-10-CM

## 2012-03-14 DIAGNOSIS — R509 Fever, unspecified: Secondary | ICD-10-CM

## 2012-03-14 LAB — POCT CBC
HCT, POC: 47.2 % (ref 43.5–53.7)
Lymph, poc: 1.9 (ref 0.6–3.4)
MCH, POC: 29.2 pg (ref 27–31.2)
MCHC: 32.2 g/dL (ref 31.8–35.4)
MID (cbc): 0.6 (ref 0–0.9)
POC Granulocyte: 7.7 — AB (ref 2–6.9)
POC LYMPH PERCENT: 18.7 %L (ref 10–50)
Platelet Count, POC: 243 10*3/uL (ref 142–424)
RDW, POC: 14 %
WBC: 10.2 10*3/uL (ref 4.6–10.2)

## 2012-03-14 LAB — POCT RAPID STREP A (OFFICE): Rapid Strep A Screen: NEGATIVE

## 2012-03-14 MED ORDER — AMOXICILLIN 875 MG PO TABS: 875.0000 mg | ORAL_TABLET | Freq: Two times a day (BID) | ORAL | Status: AC

## 2012-03-14 MED ORDER — DULOXETINE HCL 30 MG PO CPEP: 30.0000 mg | ORAL_CAPSULE | Freq: Every day | ORAL | Status: DC

## 2012-03-14 MED ORDER — DIAZEPAM 10 MG PO TABS: 10.0000 mg | ORAL_TABLET | Freq: Two times a day (BID) | ORAL | Status: AC | PRN

## 2012-03-14 MED ORDER — MAGIC MOUTHWASH W/LIDOCAINE: 5.0000 mL | Freq: Four times a day (QID) | ORAL | Status: DC | PRN

## 2012-03-14 NOTE — Patient Instructions (Signed)
Return to the clinic or go to the nearest emergency room if any of your symptoms worsen or new symptoms occur. Your should receive a call or letter about your lab results within the next week to 10 days.  No contact sports for now as mono as possibility.    Pharyngitis, Viral and Bacterial Pharyngitis is soreness (inflammation) or infection of the pharynx. It is also called a sore throat. CAUSES  Most sore throats are caused by viruses and are part of a cold. However, some sore throats are caused by strep and other bacteria. Sore throats can also be caused by post nasal drip from draining sinuses, allergies and sometimes from sleeping with an open mouth. Infectious sore throats can be spread from person to person by coughing, sneezing and sharing cups or eating utensils. TREATMENT  Sore throats that are viral usually last 3-4 days. Viral illness will get better without medications (antibiotics). Strep throat and other bacterial infections will usually begin to get better about 24-48 hours after you begin to take antibiotics. HOME CARE INSTRUCTIONS   If the caregiver feels there is a bacterial infection or if there is a positive strep test, they will prescribe an antibiotic. The full course of antibiotics must be taken. If the full course of antibiotic is not taken, you or your child may become ill again. If you or your child has strep throat and do not finish all of the medication, serious heart or kidney diseases may develop.   Drink enough water and fluids to keep your urine clear or pale yellow.   Only take over-the-counter or prescription medicines for pain, discomfort or fever as directed by your caregiver.   Get lots of rest.   Gargle with salt water ( tsp. of salt in a glass of water) as often as every 1-2 hours as you need for comfort.   Hard candies may soothe the throat if individual is not at risk for choking. Throat sprays or lozenges may also be used.  SEEK MEDICAL CARE IF:    Large, tender lumps in the neck develop.   A rash develops.   Green, yellow-brown or bloody sputum is coughed up.   Your baby is older than 3 months with a rectal temperature of 100.5 F (38.1 C) or higher for more than 1 day.  SEEK IMMEDIATE MEDICAL CARE IF:   A stiff neck develops.   You or your child are drooling or unable to swallow liquids.   You or your child are vomiting, unable to keep medications or liquids down.   You or your child has severe pain, unrelieved with recommended medications.   You or your child are having difficulty breathing (not due to stuffy nose).   You or your child are unable to fully open your mouth.   You or your child develop redness, swelling, or severe pain anywhere on the neck.   You have a fever.   Your baby is older than 3 months with a rectal temperature of 102 F (38.9 C) or higher.   Your baby is 70 months old or younger with a rectal temperature of 100.4 F (38 C) or higher.  MAKE SURE YOU:   Understand these instructions.   Will watch your condition.   Will get help right away if you are not doing well or get worse.  Document Released: 07/31/2005 Document Revised: 07/20/2011 Document Reviewed: 10/28/2007 [facility] Patient Information 2012 [place], [place].

## 2012-03-14 NOTE — Progress Notes (Signed)
Subjective:    Patient ID: Brian [name], male    DOB: 1991-04-26, 21 y.o.   MRN: [id number]  HPI Brian [name] is a 21 y.o. male  Has been on Cymbalta, and valium past 2 months for anxiety, ran out a week ago - last dose about 1 week ago. Travels for work - unable to return to Dr. Parke Simmers.  Has not seen Dr. Parke Simmers in follow up since intial prescription.   Yesterday felt like panic attack , but then headache, with possible fever yesterday.   Slight sore throat, ear pain today, bodyaches.  No known tick bites/exposure.  Travels to [place] and [place].  No known sick contacts.  L ear sore at times with this illness.   Review of Systems  Constitutional: Positive for fever and fatigue. Negative for unexpected weight change.  HENT: Positive for sore throat.   Gastrointestinal:       Slighltly sore in r side of abdomen.  No BM today.   Hematological: Positive for adenopathy (neck nodes swollen).  Psychiatric/Behavioral: Negative for suicidal ideas and dysphoric mood. The patient is nervous/anxious.   and as per HPI.      Objective:   Physical Exam  Constitutional: He appears well-developed and well-nourished.  HENT:  Head: Normocephalic and atraumatic.  Right Ear: External ear normal.  Left Ear: External ear normal.  Mouth/Throat: Mucous membranes are normal. Oropharyngeal exudate and posterior oropharyngeal erythema present. No tonsillar abscesses.    Cardiovascular: Normal rate, regular rhythm, normal heart sounds and intact distal pulses.   Pulmonary/Chest: Effort normal and breath sounds normal.  Abdominal: Normal appearance. He exhibits no distension. There is no hepatosplenomegaly. There is tenderness (min ttp ruq/rlq. no guarding.  ).  Lymphadenopathy:    He has cervical adenopathy (L greater than R ac only   no other lad appreciated. ).  Neurological: He is alert.  Psychiatric: He has a normal mood and affect. His speech is normal and behavior is normal. Judgment and thought content  normal. He expresses no suicidal ideation.   Controlled substance database - #40 diazepam 10mg  on 01/12/12.   Results for orders placed in visit on 03/14/12  POCT CBC      Component Value Range   WBC 10.2  4.6 - 10.2 K/uL   Lymph, poc 1.9  0.6 - 3.4   POC LYMPH PERCENT 18.7  10 - 50 %L   MID (cbc) 0.6  0 - 0.9   POC MID % 6.1  0 - 12 %M   POC Granulocyte 7.7 (*) 2 - 6.9   Granulocyte percent 75.2  37 - 80 %G   RBC 5.21  4.69 - 6.13 M/uL   Hemoglobin 15.2  14.1 - 18.1 g/dL   HCT, POC 40.9  81.1 - 53.7 %   MCV 90.5  80 - 97 fL   MCH, POC 29.2  27 - 31.2 pg   MCHC 32.2  31.8 - 35.4 g/dL   RDW, POC 91.4     Platelet Count, POC 243  142 - 424 K/uL   MPV 9.7  0 - 99.8 fL  POCT RAPID STREP A (OFFICE)      Component Value Range   Rapid Strep A Screen Negative  Negative        Assessment & Plan:  Brian [name] is a 21 y.o. male 1. Pharyngitis  POCT CBC, POCT rapid strep A, Culture, Group A Strep, amoxicillin (AMOXIL) 875 MG tablet, Alum & Mag Hydroxide-Simeth (MAGIC MOUTHWASH W/LIDOCAINE)  SOLN  2. Fever  POCT CBC, Comprehensive metabolic panel, POCT rapid strep A, Culture, Group A Strep, amoxicillin (AMOXIL) 875 MG tablet  3. Malaise and fatigue  Culture, Group A Strep  4. Anxiety  diazepam (VALIUM) 10 MG tablet, DULoxetine (CYMBALTA) 30 MG capsule  5. RUQ abdominal pain  POCT CBC, Comprehensive metabolic panel    Exudative tonsilitis - possible false neg strep.  amox as above.  Mono possible - discussed rash precautions. rtc precautions.   Anxiety - will prescribe short course of valium, and restart cymbalta at 30mg  QD until can be seen by primary provider - next 1 week  Did have elevated liver tests when had stomach virus, by hx.  CMP pending.  If elevated - EBV or CMV possible.   rtc if increase/worsening of sx, including abd pain.

## 2012-03-15 LAB — COMPREHENSIVE METABOLIC PANEL
AST: 22 U/L (ref 0–37)
Alkaline Phosphatase: 93 U/L (ref 39–117)
BUN: 9 mg/dL (ref 6–23)
CO2: 28 mEq/L (ref 19–32)
Glucose, Bld: 88 mg/dL (ref 70–99)
Sodium: 140 mEq/L (ref 135–145)
Total Bilirubin: 0.4 mg/dL (ref 0.3–1.2)
Total Protein: 7.4 g/dL (ref 6.0–8.3)

## 2012-03-17 LAB — CULTURE, GROUP A STREP: Organism ID, Bacteria: NORMAL

## 2012-06-18 ENCOUNTER — Other Ambulatory Visit: Payer: Self-pay | Admitting: Family Medicine

## 2013-09-27 ENCOUNTER — Emergency Department (HOSPITAL_COMMUNITY): Payer: BC Managed Care – PPO

## 2013-09-27 ENCOUNTER — Encounter (HOSPITAL_COMMUNITY): Payer: Self-pay | Admitting: Emergency Medicine

## 2013-09-27 ENCOUNTER — Emergency Department (HOSPITAL_COMMUNITY)
Admission: EM | Admit: 2013-09-27 | Discharge: 2013-09-27 | Disposition: A | Discharge status: Home / Self Care | Payer: BC Managed Care – PPO | Attending: Emergency Medicine | Admitting: Emergency Medicine

## 2013-09-27 DIAGNOSIS — S4980XA Other specified injuries of shoulder and upper arm, unspecified arm, initial encounter: Secondary | ICD-10-CM | POA: Insufficient documentation

## 2013-09-27 DIAGNOSIS — F411 Generalized anxiety disorder: Secondary | ICD-10-CM | POA: Insufficient documentation

## 2013-09-27 DIAGNOSIS — S060XAA Concussion with loss of consciousness status unknown, initial encounter: Secondary | ICD-10-CM

## 2013-09-27 DIAGNOSIS — S298XXA Other specified injuries of thorax, initial encounter: Secondary | ICD-10-CM | POA: Insufficient documentation

## 2013-09-27 DIAGNOSIS — R51 Headache: Secondary | ICD-10-CM

## 2013-09-27 DIAGNOSIS — Y929 Unspecified place or not applicable: Secondary | ICD-10-CM | POA: Insufficient documentation

## 2013-09-27 DIAGNOSIS — M2550 Pain in unspecified joint: Secondary | ICD-10-CM

## 2013-09-27 DIAGNOSIS — Z79899 Other long term (current) drug therapy: Secondary | ICD-10-CM | POA: Insufficient documentation

## 2013-09-27 DIAGNOSIS — S060X9A Concussion with loss of consciousness of unspecified duration, initial encounter: Secondary | ICD-10-CM | POA: Insufficient documentation

## 2013-09-27 DIAGNOSIS — IMO0002 Reserved for concepts with insufficient information to code with codable children: Secondary | ICD-10-CM | POA: Insufficient documentation

## 2013-09-27 DIAGNOSIS — Z9889 Other specified postprocedural states: Secondary | ICD-10-CM | POA: Insufficient documentation

## 2013-09-27 DIAGNOSIS — R519 Headache, unspecified: Secondary | ICD-10-CM

## 2013-09-27 DIAGNOSIS — Y9352 Activity, horseback riding: Secondary | ICD-10-CM | POA: Insufficient documentation

## 2013-09-27 DIAGNOSIS — S46909A Unspecified injury of unspecified muscle, fascia and tendon at shoulder and upper arm level, unspecified arm, initial encounter: Secondary | ICD-10-CM | POA: Insufficient documentation

## 2013-09-27 MED ORDER — HYDROCODONE-ACETAMINOPHEN 5-325 MG PO TABS
2.0000 | ORAL_TABLET | Freq: Once | ORAL | Status: AC
  Administered 2013-09-27: 2 via ORAL
  Filled 2013-09-27: qty 2

## 2013-09-27 MED ORDER — METHOCARBAMOL 500 MG PO TABS: 500.0000 mg | ORAL_TABLET | Freq: Two times a day (BID) | ORAL | Status: DC | PRN

## 2013-09-27 MED ORDER — HYDROCODONE-ACETAMINOPHEN 5-325 MG PO TABS: 1.0000 | ORAL_TABLET | ORAL | Status: DC | PRN

## 2013-09-27 MED ORDER — NAPROXEN 500 MG PO TABS: 500.0000 mg | ORAL_TABLET | Freq: Two times a day (BID) | ORAL | Status: DC

## 2013-09-27 NOTE — ED Provider Notes (Signed)
CSN: [id number]     Arrival date & time 09/27/13  1904 History   First MD Initiated Contact with Patient 09/27/13 1911     Chief Complaint  Patient presents with  . Fall     (Consider location/radiation/quality/duration/timing/severity/associated sxs/prior Treatment) The history is provided by the patient, a parent and medical records. No language interpreter was used.    Brian [name] is a 23 y.o. male  with a hx of anxiety (lumber surgery to correct bulging discs from weight lifting in 2011 and 2013) presents to the Emergency Department complaining of acute, persistent headache with associated blurry vision, right shoulder, neck pain and t-spine pain after being thrown from a horse approx 1.5 hours ago.  Pt endorses a brief LOC at the scene, but was immediately ambulatory and drove home. Pt did not take any OTC medications for the pain.   Pt denies fever,chills, chest pain, SOB, abd pain, N/V/D, weakness, dizziness, dysuria.  Pt reports several sips of beer after returning home.      Past Medical History  Diagnosis Date  . Anxiety    Past Surgical History  Procedure Laterality Date  . Anterior cruciate ligament repair    . Back surgery     No family history on file. History  Substance Use Topics  . Smoking status: Never Smoker   . Smokeless tobacco: Not on file  . Alcohol Use: Yes    Review of Systems  Constitutional: Negative for fever, diaphoresis, appetite change, fatigue and unexpected weight change.  HENT: Negative for mouth sores.   Eyes: Positive for visual disturbance.  Respiratory: Negative for cough, chest tightness, shortness of breath and wheezing.   Cardiovascular: Positive for chest pain (right ribs).  Gastrointestinal: Negative for nausea, vomiting, abdominal pain, diarrhea and constipation.  Endocrine: Negative for polydipsia, polyphagia and polyuria.  Genitourinary: Negative for dysuria, urgency, frequency and hematuria.  Musculoskeletal: Positive for  arthralgias (right shoulder). Negative for back pain and neck stiffness.  Skin: Negative for rash.  Allergic/Immunologic: Negative for immunocompromised state.  Neurological: Positive for syncope and headaches. Negative for light-headedness.  Hematological: Does not bruise/bleed easily.  Psychiatric/Behavioral: Negative for sleep disturbance. The patient is not nervous/anxious.       Allergies  Review of patient's allergies indicates no known allergies.  Home Medications   Current Outpatient Rx  Name  Route  Sig  Dispense  Refill  . diazepam (VALIUM) 5 MG tablet   Oral   Take 5 mg by mouth every 6 (six) hours as needed for anxiety.         Marland Kitchen. EXPIRED: DULoxetine (CYMBALTA) 30 MG capsule   Oral   Take 1 capsule (30 mg total) by mouth daily.   30 capsule   0   . HYDROcodone-acetaminophen (NORCO/VICODIN) 5-325 MG per tablet   Oral   Take 1 tablet by mouth every 4 (four) hours as needed.   10 tablet   0   . methocarbamol (ROBAXIN) 500 MG tablet   Oral   Take 1 tablet (500 mg total) by mouth 2 (two) times daily as needed for muscle spasms.   20 tablet   0   . naproxen (NAPROSYN) 500 MG tablet   Oral   Take 1 tablet (500 mg total) by mouth 2 (two) times daily with a meal.   30 tablet   0    BP 110/65  Pulse 72  Temp(Src) 97.4 F (36.3 C) (Oral)  Resp 18  Ht 5\' 11"  (1.803 m)  Wt 229 lb (103.874 kg)  BMI 31.95 kg/m2  SpO2 100% Physical Exam  Nursing note and vitals reviewed. Constitutional: He is oriented to person, place, and time. He appears well-developed and well-nourished. No distress.  HENT:  Head: Normocephalic and atraumatic.  Right Ear: Tympanic membrane, external ear and ear canal normal.  Left Ear: Tympanic membrane, external ear and ear canal normal.  Nose: Nose normal.  Mouth/Throat: Uvula is midline, oropharynx is clear and moist and mucous membranes are normal. No uvula swelling. No oropharyngeal exudate.  Eyes: Conjunctivae and EOM are normal.  Pupils are equal, round, and reactive to light. No scleral icterus.  Neck: Normal range of motion. Neck supple. No spinous process tenderness and no muscular tenderness present. No rigidity. Normal range of motion present.  C-collar in place No midline or paraspinal tenderness Trachea midline Phonation normal  Cardiovascular: Normal rate, regular rhythm, normal heart sounds and intact distal pulses.   No murmur heard. Pulses:      Radial pulses are 2+ on the right side, and 2+ on the left side.       Dorsalis pedis pulses are 2+ on the right side, and 2+ on the left side.       Posterior tibial pulses are 2+ on the right side, and 2+ on the left side.  Irregular rate and rhythm No murmur  Pulmonary/Chest: Effort normal and breath sounds normal. No accessory muscle usage. No respiratory distress. He has no decreased breath sounds. He has no wheezes. He has no rhonchi. He has no rales. He exhibits tenderness and bony tenderness.  Pain to palpation of the right ribs Clear and equal breath sounds with good tidal volume No contusions  Abdominal: Soft. Normal appearance and bowel sounds are normal. He exhibits no distension. There is no tenderness. There is no rigidity, no rebound, no guarding and no CVA tenderness.  No contusions or ecchymosis  Musculoskeletal: Normal range of motion. He exhibits no tenderness.       Thoracic back: He exhibits normal range of motion.       Lumbar back: He exhibits normal range of motion.  Full range of motion of the T-spine and L-spine No tenderness to palpation of the spinous processes of the T-spine or L-spine No tenderness to palpation of the paraspinous muscles of the L-spine  Lymphadenopathy:    He has no cervical adenopathy.  Neurological: He is alert and oriented to person, place, and time. He has normal reflexes. No cranial nerve deficit. He exhibits normal muscle tone. Coordination normal. GCS eye subscore is 4. GCS verbal subscore is 5. GCS motor  subscore is 6.  Reflex Scores:      Tricep reflexes are 2+ on the right side and 2+ on the left side.      Bicep reflexes are 2+ on the right side and 2+ on the left side.      Brachioradialis reflexes are 2+ on the right side and 2+ on the left side.      Patellar reflexes are 2+ on the right side and 2+ on the left side.      Achilles reflexes are 2+ on the right side and 2+ on the left side. Speech is clear and goal oriented, follows commands Cranial nerves III - XII without deficit, no facial droop Normal strength in upper and lower extremities bilaterally, strong and equal grip strength Sensation normal to light and sharp touch Moves extremities without ataxia, coordination intact Normal finger to nose and rapid alternating  movements Neg romberg, no pronator drift Negative heel shin while sitting  Skin: Skin is warm and dry. No rash noted. He is not diaphoretic. No erythema.  Psychiatric: He has a normal mood and affect. His behavior is normal. Judgment and thought content normal.    ED Course  Procedures (including critical care time) Labs Review Labs Reviewed - No data to display Imaging Review Dg Ribs Unilateral W/chest Right  09/27/2013   CLINICAL DATA:  Right rib pain after fall.  EXAM: RIGHT RIBS AND CHEST - 3+ VIEW  COMPARISON:  None.  FINDINGS: No fracture or other bone lesions are seen involving the ribs. There is no evidence of pneumothorax or pleural effusion. Both lungs are clear. Heart size and mediastinal contours are within normal limits.  IMPRESSION: Normal right ribs.   Electronically Signed   By: Roque Lias M.D.   On: 09/27/2013 20:48   Dg Shoulder Right  09/27/2013   CLINICAL DATA:  Right shoulder pain after fall.  EXAM: RIGHT SHOULDER - 2+ VIEW  COMPARISON:  None.  FINDINGS: There is no evidence of fracture or dislocation. There is no evidence of arthropathy or other focal bone abnormality. Soft tissues are unremarkable.  IMPRESSION: Normal right shoulder.    Electronically Signed   By: Roque Lias M.D.   On: 09/27/2013 20:46   Ct Head Wo Contrast  09/27/2013   CLINICAL DATA:  Thrown from horse, neck pain and loss of consciousness  EXAM: CT HEAD WITHOUT CONTRAST  CT CERVICAL SPINE WITHOUT CONTRAST  TECHNIQUE: Multidetector CT imaging of the head and cervical spine was performed following the standard protocol without intravenous contrast. Multiplanar CT image reconstructions of the cervical spine were also generated.  COMPARISON:  Prior brain MRI 12/04/2005  FINDINGS: CT HEAD FINDINGS  Negative for acute intracranial hemorrhage, acute infarction, mass, mass effect, hydrocephalus or midline shift. Gray-white differentiation is preserved throughout. A No focal soft tissue or calvarial abnormality. Globes and orbits are intact and symmetric bilaterally. Normal aeration of the mastoid air cells and visualized paranasal sinuses.  CT CERVICAL SPINE FINDINGS  No acute fracture, malalignment or prevertebral soft tissue swelling. Mild early degenerative disc disease at C4-C5. Unremarkable CT appearance of the thyroid gland. No acute soft tissue abnormality. The lung apices are unremarkable.  IMPRESSION: 1. Negative head CT. 2. No acute fracture or malalignment of the cervical spine.   Electronically Signed   By: Malachy Moan M.D.   On: 09/27/2013 21:17   Ct Cervical Spine Wo Contrast  09/27/2013   CLINICAL DATA:  Thrown from horse, neck pain and loss of consciousness  EXAM: CT HEAD WITHOUT CONTRAST  CT CERVICAL SPINE WITHOUT CONTRAST  TECHNIQUE: Multidetector CT imaging of the head and cervical spine was performed following the standard protocol without intravenous contrast. Multiplanar CT image reconstructions of the cervical spine were also generated.  COMPARISON:  Prior brain MRI 12/04/2005  FINDINGS: CT HEAD FINDINGS  Negative for acute intracranial hemorrhage, acute infarction, mass, mass effect, hydrocephalus or midline shift. Gray-white differentiation is  preserved throughout. A No focal soft tissue or calvarial abnormality. Globes and orbits are intact and symmetric bilaterally. Normal aeration of the mastoid air cells and visualized paranasal sinuses.  CT CERVICAL SPINE FINDINGS  No acute fracture, malalignment or prevertebral soft tissue swelling. Mild early degenerative disc disease at C4-C5. Unremarkable CT appearance of the thyroid gland. No acute soft tissue abnormality. The lung apices are unremarkable.  IMPRESSION: 1. Negative head CT. 2. No acute fracture or malalignment  of the cervical spine.   Electronically Signed   By: Malachy Moan M.D.   On: 09/27/2013 21:17    EKG Interpretation   None       MDM   Final diagnoses:  Fall from horse  Headache  Arthralgia  Concussion    Brian [name] presents with headache, blurred vision, neck pain, right shoulder pain and right-sided rib pain after being thrown from a horse several hours prior to arrival. Patient endorses shortness of consciousness but was able to ambulate without difficulty afterwards.  He reports that his pain has gradually increased since the accident.  No focal neurologic deficits on exam. Patient continues to complain of blurred vision though it is improved from initial. Will obtain head and neck CT and image right shoulder and chest.  Will provide pain control.  9:53 PM CT head/neck without acute abnormality.  Right shoulder and ribs without abnormality.  On re-evaluation pt with resolved blurry vision.  He ambulates without difficulty or gait disturbance.  He remains without focal neurologic deficit on exam.  Full range of motion of his neck with only minimal pain.    Patient without signs of serious head, neck, or back injury. Normal neurological exam. No concern for closed head injury, lung injury, or intraabdominal injury. Normal muscle soreness after fall.  D/t pts normal radiology & ability to ambulate in ED pt will be dc home with symptomatic therapy. Pt has  been instructed to follow up with their doctor if symptoms persist. Home conservative therapies for pain including ice and heat tx have been discussed. Pt is hemodynamically stable, in NAD, & able to ambulate in the ED. Pain has been managed & has no complaints prior to dc.  It has been determined that no acute conditions requiring further emergency intervention are present at this time. The patient/guardian have been advised of the diagnosis and plan. We have discussed signs and symptoms that warrant return to the ED, such as changes or worsening in symptoms.   Vital signs are stable at discharge.   BP 110/65  Pulse 72  Temp(Src) 97.4 F (36.3 C) (Oral)  Resp 18  Ht 5\' 11"  (1.803 m)  Wt 229 lb (103.874 kg)  BMI 31.95 kg/m2  SpO2 100%  Patient/guardian has voiced understanding and agreed to follow-up with the PCP or specialist.        Dierdre Forth, PA-C 09/27/13 2207

## 2013-09-27 NOTE — ED Notes (Signed)
The pt fell off an untrained horse earl;ier today.  He thinks he passed out for a few seconds. C/o head neck and back pain.  With some shoulder pain

## 2013-09-27 NOTE — Discharge Instructions (Signed)
1. Medications: robaxin, naproxyn, vicodin, usual home medications 2. Treatment: rest, drink plenty of fluids, gentle stretching as discussed, alternate ice and heat for your neck and sore joints 3. Follow Up: Please followup with your primary doctor for discussion of your diagnoses and further evaluation after today's visit; your must see Dr. Parke Simmers before returning to contact sports.  Concussion, Adult A concussion, or closed-head injury, is a brain injury caused by a direct blow to the head or by a quick and sudden movement (jolt) of the head or neck. Concussions are usually not life-threatening. Even so, the effects of a concussion can be serious. If you have had a concussion before, you are more likely to experience concussion-like symptoms after a direct blow to the head.  CAUSES   Direct blow to the head, such as from running into another player during a soccer game, being hit in a fight, or hitting your head on a hard surface.  A jolt of the head or neck that causes the brain to move back and forth inside the skull, such as in a car crash. SIGNS AND SYMPTOMS  The signs of a concussion can be hard to notice. Early on, they may be missed by you, family members, and health care providers. You may look fine but act or feel differently. Symptoms are usually temporary, but they may last for days, weeks, or even longer. Some symptoms may appear right away while others may not show up for hours or days. Every head injury is different. Symptoms include:   Mild to moderate headaches that will not go away.  A feeling of pressure inside your head.  Having more trouble than usual:   Learning or remembering things you have heard.  Answering questions.  Paying attention or concentrating.   Organizing daily tasks.   Making decisions and solving problems.   Slowness in thinking, acting or reacting, speaking, or reading.   Getting lost or being easily confused.   Feeling tired all the  time or lacking energy (fatigued).   Feeling drowsy.   Sleep disturbances.   Sleeping more than usual.   Sleeping less than usual.   Trouble falling asleep.   Trouble sleeping (insomnia).   Loss of balance or feeling lightheaded or dizzy.   Nausea or vomiting.   Numbness or tingling.   Increased sensitivity to:   Sounds.   Lights.   Distractions.   Vision problems or eyes that tire easily.   Diminished sense of taste or smell.   Ringing in the ears.   Mood changes such as feeling sad or anxious.   Becoming easily irritated or angry for little or no reason.   Lack of motivation.  Seeing or hearing things other people do not see or hear (hallucinations). DIAGNOSIS  Your health care provider can usually diagnose a concussion based on a description of your injury and symptoms. He or she will ask whether you passed out (lost consciousness) and whether you are having trouble remembering events that happened right before and during your injury.  Your evaluation might include:   A brain scan to look for signs of injury to the brain. Even if the test shows no injury, you may still have a concussion.   Blood tests to be sure other problems are not present. TREATMENT   Concussions are usually treated in an emergency department, in urgent care, or at a clinic. You may need to stay in the hospital overnight for further treatment.   Tell your health  care provider if you are taking any medicines, including prescription medicines, over-the-counter medicines, and natural remedies. Some medicines, such as blood thinners (anticoagulants) and aspirin, may increase the chance of complications. Also tell your health care provider whether you have had alcohol or are taking illegal drugs. This information may affect treatment.  Your health care provider will send you home with important instructions to follow.  How fast you will recover from a concussion depends  on many factors. These factors include how severe your concussion is, what part of your brain was injured, your age, and how healthy you were before the concussion.  Most people with mild injuries recover fully. Recovery can take time. In general, recovery is slower in older persons. Also, persons who have had a concussion in the past or have other medical problems may find that it takes longer to recover from their current injury. HOME CARE INSTRUCTIONS  General Instructions  Carefully follow the directions your health care provider gave you.  Only take over-the-counter or prescription medicines for pain, discomfort, or fever as directed by your health care provider.  Take only those medicines that your health care provider has approved.  Do not drink alcohol until your health care provider says you are well enough to do so. Alcohol and certain other drugs may slow your recovery and can put you at risk of further injury.  If it is harder than usual to remember things, write them down.  If you are easily distracted, try to do one thing at a time. For example, do not try to watch TV while fixing dinner.  Talk with family members or close friends when making important decisions.  Keep all follow-up appointments. Repeated evaluation of your symptoms is recommended for your recovery.  Watch your symptoms and tell others to do the same. Complications sometimes occur after a concussion. Older adults with a brain injury may have a higher risk of serious complications such as of a blood clot on the brain.  Tell your teachers, school nurse, school counselor, coach, athletic trainer, or work Production designer, theatre/television/filmmanager about your injury, symptoms, and restrictions. Tell them about what you can or cannot do. They should watch for:   Increased problems with attention or concentration.   Increased difficulty remembering or learning new information.   Increased time needed to complete tasks or assignments.    Increased irritability or decreased ability to cope with stress.   Increased symptoms.   Rest. Rest helps the brain to heal. Make sure you:  Get plenty of sleep at night. Avoid staying up late at night.  Keep the same bedtime hours on weekends and weekdays.  Rest during the day. Take daytime naps or rest breaks when you feel tired.  Limit activities that require a lot of thought or concentration. These includes   Doing homework or job-related work.   Watching TV.   Working on the computer.  Avoid any situation where there is potential for another head injury (football, hockey, soccer, basketball, martial arts, downhill snow sports and horseback riding). Your condition will get worse every time you experience a concussion. You should avoid these activities until you are evaluated by the appropriate follow-up caregivers. Returning To Your Regular Activities You will need to return to your normal activities slowly, not all at once. You must give your body and brain enough time for recovery.  Do not return to sports or other athletic activities until your health care provider tells you it is safe to do so.  Ask your health care provider when you can drive, ride a bicycle, or operate heavy machinery. Your ability to react may be slower after a brain injury. Never do these activities if you are dizzy.  Ask your health care provider about when you can return to work or school. Preventing Another Concussion It is very important to avoid another brain injury, especially before you have recovered. In rare cases, another injury can lead to permanent brain damage, brain swelling, or death. The risk of this is greatest during the first 7 10 days after a head injury. Avoid injuries by:   Wearing a seat belt when riding in a car.   Drinking alcohol only in moderation.   Wearing a helmet when biking, skiing, skateboarding, skating, or doing similar activities.  Avoiding activities  that could lead to a second concussion, such as contact or recreational sports, until your health care provider says it is OK.  Taking safety measures in your home.   Remove clutter and tripping hazards from floors and stairways.   Use grab bars in bathrooms and handrails by stairs.   Place non-slip mats on floors and in bathtubs.   Improve lighting in dim areas. SEEK MEDICAL CARE IF:   You have increased problems paying attention or concentrating.   You have increased difficulty remembering or learning new information.   You need more time to complete tasks or assignments than before.   You have increased irritability or decreased ability to cope with stress.  You have more symptoms than before. Seek medical care if you have any of the following symptoms for more than 2 weeks after your injury:   Lasting (chronic) headaches.   Dizziness or balance problems.   Nausea.  Vision problems.   Increased sensitivity to noise or light.   Depression or mood swings.   Anxiety or irritability.   Memory problems.   Difficulty concentrating or paying attention.   Sleep problems.   Feeling tired all the time. SEEK IMMEDIATE MEDICAL CARE IF:   You have severe or worsening headaches. These may be a sign of a blood clot in the brain.  You have weakness (even if only in one hand, leg, or part of the face).  You have numbness.  You have decreased coordination.   You vomit repeatedly.  You have increased sleepiness.  One pupil is larger than the other.   You have convulsions.   You have slurred speech.   You have increased confusion. This may be a sign of a blood clot in the brain.  You have increased restlessness, agitation, or irritability.   You are unable to recognize people or places.   You have neck pain.   It is difficult to wake you up.   You have unusual behavior changes.   You lose consciousness. MAKE SURE YOU:    Understand these instructions.  Will watch your condition.  Will get help right away if you are not doing well or get worse. Document Released: 10/21/2003 Document Revised: 04/02/2013 Document Reviewed: 02/20/2013 [facility] Patient Information 2014 [place], [place].

## 2013-09-27 NOTE — ED Notes (Signed)
Per pt, was riding a horse, horse took off, bucked pt off, landed on a bunch of sticks, landed on his right shoulder and then whiplashed the back of his head into the ground. Pt states he lost conciousness for "about point two seconds". C collar applied in triage. Pt c/o right shoulder pain and right upper back pain. Pt has no visible injuries, full ROM on right shoulder. No visible injury on back of head. Pt AAOx4.

## 2013-09-27 NOTE — ED Notes (Signed)
Dahlia ClientHannah, PA cleared PT C collar, C collar removed. Pt has full ROM with neck.

## 2013-09-28 NOTE — ED Provider Notes (Signed)
Medical screening examination/treatment/procedure(s) were performed by non-physician practitioner and as supervising physician I was immediately available for consultation/collaboration.  EKG Interpretation   None         Gwyneth SproutWhitney Arlee Santosuosso, MD 09/28/13 1212

## 2014-02-11 ENCOUNTER — Encounter (HOSPITAL_COMMUNITY): Payer: Self-pay | Admitting: Emergency Medicine

## 2014-02-11 ENCOUNTER — Emergency Department (HOSPITAL_COMMUNITY): Payer: BC Managed Care – PPO

## 2014-02-11 ENCOUNTER — Emergency Department (HOSPITAL_COMMUNITY)
Admission: EM | Admit: 2014-02-11 | Discharge: 2014-02-11 | Disposition: A | Discharge status: Home / Self Care | Payer: BC Managed Care – PPO | Attending: Emergency Medicine | Admitting: Emergency Medicine

## 2014-02-11 DIAGNOSIS — F411 Generalized anxiety disorder: Secondary | ICD-10-CM | POA: Insufficient documentation

## 2014-02-11 DIAGNOSIS — G43009 Migraine without aura, not intractable, without status migrainosus: Secondary | ICD-10-CM

## 2014-02-11 DIAGNOSIS — Z79899 Other long term (current) drug therapy: Secondary | ICD-10-CM | POA: Insufficient documentation

## 2014-02-11 DIAGNOSIS — G43909 Migraine, unspecified, not intractable, without status migrainosus: Secondary | ICD-10-CM | POA: Insufficient documentation

## 2014-02-11 LAB — BASIC METABOLIC PANEL
ANION GAP: 12 (ref 5–15)
BUN: 15 mg/dL (ref 6–23)
CALCIUM: 9.3 mg/dL (ref 8.4–10.5)
CO2: 28 mEq/L (ref 19–32)
CREATININE: 1.07 mg/dL (ref 0.50–1.35)
Chloride: 101 mEq/L (ref 96–112)
GFR calc non Af Amer: >90 mL/min (ref >90)
Glucose, Bld: 87 mg/dL (ref 70–99)
Potassium: 4.2 mEq/L (ref 3.7–5.3)
Sodium: 141 mEq/L (ref 137–147)

## 2014-02-11 LAB — CBC WITH DIFFERENTIAL/PLATELET
BASOS ABS: 0 10*3/uL (ref 0.0–0.1)
BASOS PCT: 0 % (ref 0–1)
EOS ABS: 0.1 10*3/uL (ref 0.0–0.7)
EOS PCT: 1 % (ref 0–5)
HEMATOCRIT: 45.3 % (ref 39.0–52.0)
HEMOGLOBIN: 15 g/dL (ref 13.0–17.0)
Lymphocytes Relative: 25 % (ref 12–46)
Lymphs Abs: 1.9 10*3/uL (ref 0.7–4.0)
MCH: 29.2 pg (ref 26.0–34.0)
MCHC: 33.1 g/dL (ref 30.0–36.0)
MCV: 88.3 fL (ref 78.0–100.0)
MONO ABS: 0.7 10*3/uL (ref 0.1–1.0)
MONOS PCT: 10 % (ref 3–12)
Neutro Abs: 4.8 10*3/uL (ref 1.7–7.7)
Neutrophils Relative %: 64 % (ref 43–77)
Platelets: 246 10*3/uL (ref 150–400)
RBC: 5.13 MIL/uL (ref 4.22–5.81)
RDW: 12.9 % (ref 11.5–15.5)
WBC: 7.5 10*3/uL (ref 4.0–10.5)

## 2014-02-11 LAB — SEDIMENTATION RATE: Sed Rate: 9 mm/hr (ref 0–16)

## 2014-02-11 MED ORDER — SODIUM CHLORIDE 0.9 % IV BOLUS (SEPSIS)
1000.0000 mL | Freq: Once | INTRAVENOUS | Status: AC
  Administered 2014-02-11: 1000 mL via INTRAVENOUS

## 2014-02-11 MED ORDER — DEXAMETHASONE SODIUM PHOSPHATE 10 MG/ML IJ SOLN
10.0000 mg | Freq: Once | INTRAMUSCULAR | Status: AC
  Administered 2014-02-11: 10 mg via INTRAVENOUS
  Filled 2014-02-11: qty 1

## 2014-02-11 MED ORDER — KETOROLAC TROMETHAMINE 30 MG/ML IJ SOLN
30.0000 mg | Freq: Once | INTRAMUSCULAR | Status: AC
  Administered 2014-02-11: 30 mg via INTRAVENOUS
  Filled 2014-02-11: qty 1

## 2014-02-11 MED ORDER — DIPHENHYDRAMINE HCL 50 MG/ML IJ SOLN: 25.0000 mg | Freq: Once | INTRAMUSCULAR | Status: AC

## 2014-02-11 MED ORDER — METOCLOPRAMIDE HCL 5 MG/ML IJ SOLN
10.0000 mg | INTRAMUSCULAR | Status: AC
  Administered 2014-02-11: 10 mg via INTRAVENOUS
  Filled 2014-02-11: qty 2

## 2014-02-11 MED ORDER — PROCHLORPERAZINE EDISYLATE 5 MG/ML IJ SOLN: 10.0000 mg | Freq: Once | INTRAMUSCULAR | Status: AC

## 2014-02-11 NOTE — ED Notes (Signed)
Pt c/o left sided HA with blurry vision and pain into neck x 3 days; pt sts nausea and photophobia

## 2014-02-11 NOTE — ED Notes (Signed)
PA at bedside.

## 2014-02-11 NOTE — ED Notes (Signed)
Patient transported to CT 

## 2014-02-11 NOTE — ED Provider Notes (Signed)
CSN: [id number]     Arrival date & time 02/11/14  1129 History   First MD Initiated Contact with Patient 02/11/14 1230     Chief Complaint  Patient presents with  . Headache  . Blurred Vision    (Consider location/radiation/quality/duration/timing/severity/associated sxs/prior Treatment) HPI Comments: Patient is a 23 year old male with no significant past medical history who presents for headache with onset 4 hours ago. Patient states that he was at work when he had a sudden onset of left temporal headache which he describes as aching and throbbing in nature. Patient states the pain radiates down the left side of his head to the left side of his neck. He has taken ibuprofen for symptoms without improvement. Patient states that symptoms have been associated with photophobia, phonophobia, and nausea. Patient also endorses some intermittent blurry vision in his left eye which has since resolved spontaneously. Patient states that when he was at work he had a difficult time communicating to his boss that he needed to leave; states, "I had trouble finding the right words". Patient denies a hx of headaches or migraines. He denies associated fever, vision loss, hearing loss, difficulty swallowing, neck stiffness, CP, SOB, syncope, near syncope, vomiting, diarrhea, numbness/paresthesias, and extremity weakness.  Patient is a 23 y.o. male presenting with headaches. The history is provided by the patient. No language interpreter was used.  Headache Associated symptoms: nausea, neck pain (L sided radiating from L temporal region) and photophobia   Associated symptoms: no fever, no neck stiffness, no numbness and no vomiting     Past Medical History  Diagnosis Date  . Anxiety    Past Surgical History  Procedure Laterality Date  . Anterior cruciate ligament repair    . Back surgery     History reviewed. No pertinent family history. History  Substance Use Topics  . Smoking status: Never Smoker   .  Smokeless tobacco: Not on file  . Alcohol Use: Yes    Review of Systems  Constitutional: Negative for fever.  HENT: Negative for tinnitus and trouble swallowing.   Eyes: Positive for photophobia and visual disturbance.       No vision loss  Respiratory: Negative for shortness of breath.   Cardiovascular: Negative for chest pain.  Gastrointestinal: Positive for nausea. Negative for vomiting.  Musculoskeletal: Positive for neck pain (L sided radiating from L temporal region). Negative for neck stiffness.  Neurological: Positive for speech difficulty and headaches. Negative for syncope, weakness and numbness.  All other systems reviewed and are negative.    Allergies  Review of patient's allergies indicates no known allergies.  Home Medications   Prior to Admission medications   Medication Sig Start Date End Date Taking? Authorizing Provider  diazepam (VALIUM) 5 MG tablet Take 5 mg by mouth at bedtime.    Yes Historical Provider, MD  ibuprofen (ADVIL,MOTRIN) 200 MG tablet Take 800 mg by mouth every 6 (six) hours as needed.   Yes Historical Provider, MD  DULoxetine (CYMBALTA) 30 MG capsule Take 1 capsule (30 mg total) by mouth daily. 03/14/12 03/14/13  Shade FloodJeffrey R Greene, MD   BP 152/99  Pulse 63  Temp(Src) 97.6 F (36.4 C) (Oral)  Resp 10  Ht 5\' 11"  (1.803 m)  Wt 245 lb (111.131 kg)  BMI 34.19 kg/m2  SpO2 99%  Physical Exam  Nursing note and vitals reviewed. Constitutional: He is oriented to person, place, and time. He appears well-developed and well-nourished. No distress.  Nontoxic/nonseptic appearing  HENT:  Head: Normocephalic and  atraumatic.  Mouth/Throat: Oropharynx is clear and moist. No oropharyngeal exudate.  Finger rubbing heard to 693ft b/l.  Eyes: Conjunctivae and EOM are normal. Pupils are equal, round, and reactive to light. No scleral icterus.  Normal EOMs; no nystagmus.  Neck: Normal range of motion. Neck supple.  No nuchal rigidity or meningismus.   Cardiovascular: Normal rate, regular rhythm, normal heart sounds and intact distal pulses.   No carotid bruits b/l  Pulmonary/Chest: Effort normal and breath sounds normal. No stridor. No respiratory distress. He has no wheezes. He has no rales.  Abdominal: Soft. There is no tenderness. There is no rebound and no guarding.  Soft, nontender  Musculoskeletal: Normal range of motion.  Neurological: He is alert and oriented to person, place, and time. No cranial nerve deficit. He exhibits normal muscle tone. Coordination normal.  GCS 15. Patient speaks in full goal oriented sentences. No cranial nerve deficits appreciated; symmetric eyebrow rate, no facial drooping, tongue midline. Equal grip strength and strength against resistance bilaterally. No gross sensory deficits. Patient moves extremities without ataxia. Finger to nose intact.  Skin: Skin is warm and dry. No rash noted. He is not diaphoretic. No erythema. No pallor.  Psychiatric: He has a normal mood and affect. His behavior is normal.    ED Course  Procedures (including critical care time) Labs Review Labs Reviewed  SEDIMENTATION RATE  CBC WITH DIFFERENTIAL  BASIC METABOLIC PANEL    Imaging Review Ct Head Wo Contrast  02/11/2014   CLINICAL DATA:  Headache and blurred vision ; nausea  EXAM: CT HEAD WITHOUT CONTRAST  TECHNIQUE: Contiguous axial images were obtained from the base of the skull through the vertex without intravenous contrast.  COMPARISON:  September 27, 2013  FINDINGS: The ventricles are normal in size and configuration. There is no mass, hemorrhage, extra-axial fluid collection, or midline shift. The gray-white compartments are normal. No acute infarct apparent. Bony calvarium appears intact. The visualized mastoid air cells are clear.  IMPRESSION: Study within normal limits.   Electronically Signed   By: Bretta BangWilliam  Woodruff M.D.   On: 02/11/2014 14:19     EKG Interpretation None      MDM   Final diagnoses:   Nonintractable migraine, unspecified migraine type    Patient presents for L temporal headache with associated photophobia, phonophobia, and nausea. Neurologic exam nonfocal. No fever, nuchal rigidity, or meningismus to suggest meningitis. Patient denies any trauma or injury inciting his symptoms. Workup today has been reassuring and unremarkable. CT imaging shows no mass, hemorrhage, hydrocephalus, or midline shift. Headache is improved from 10/10 to 5/10 with migraine cocktail and IVF. Photophobia, phonophobia, and nausea resolving.  Doubt emergent process as cause of symptoms today. Symptoms c/w migraine headache. Patient stable for d/c with instruction to f/u with his primary care provider for further evaluation of symptoms. Will also refer to neurology as patient with no known hx of migraines. Return precautions provided and patient agreeable to plan with no unaddressed concerns.   Filed Vitals:   02/11/14 1505 02/11/14 1515 02/11/14 1530 02/11/14 1545  BP: 152/99 138/89 135/69 136/82  Pulse: 63 56 49 110  Temp:      TempSrc:      Resp: 10 16 22 18   Height:      Weight:      SpO2: 99% 99% 98% 100%     Antony MaduraKelly Kyla Duffy, PA-C 02/11/14 1559

## 2014-02-11 NOTE — Discharge Instructions (Signed)
Breast/sleep in a quiet dark room for at least 8 hours to ensure the symptoms resolve. Follow up with your primary care doctor regarding her symptoms today. You may take Excedrin Migraine as needed for persistent symptoms. Return as needed if symptoms worsen.  Migraine Headache A migraine headache is an intense, throbbing pain on one or both sides of your head. A migraine can last for 30 minutes to several hours. CAUSES  The exact cause of a migraine headache is not always known. However, a migraine may be caused when nerves in the brain become irritated and release chemicals that cause inflammation. This causes pain. Certain things may also trigger migraines, such as:  Alcohol.  Smoking.  Stress.  Menstruation.  Aged cheeses.  Foods or drinks that contain nitrates, glutamate, aspartame, or tyramine.  Lack of sleep.  Chocolate.  Caffeine.  Hunger.  Physical exertion.  Fatigue.  Medicines used to treat chest pain (nitroglycerine), birth control pills, estrogen, and some blood pressure medicines. SIGNS AND SYMPTOMS  Pain on one or both sides of your head.  Pulsating or throbbing pain.  Severe pain that prevents daily activities.  Pain that is aggravated by any physical activity.  Nausea, vomiting, or both.  Dizziness.  Pain with exposure to bright lights, loud noises, or activity.  General sensitivity to bright lights, loud noises, or smells. Before you get a migraine, you may get warning signs that a migraine is coming (aura). An aura may include:  Seeing flashing lights.  Seeing bright spots, halos, or zig-zag lines.  Having tunnel vision or blurred vision.  Having feelings of numbness or tingling.  Having trouble talking.  Having muscle weakness. DIAGNOSIS  A migraine headache is often diagnosed based on:  Symptoms.  Physical exam.  A CT scan or MRI of your head. These imaging tests cannot diagnose migraines, but they can help rule out other  causes of headaches. TREATMENT Medicines may be given for pain and nausea. Medicines can also be given to help prevent recurrent migraines.  HOME CARE INSTRUCTIONS  Only take over-the-counter or prescription medicines for pain or discomfort as directed by your health care provider. The use of long-term narcotics is not recommended.  Lie down in a dark, quiet room when you have a migraine.  Keep a journal to find out what may trigger your migraine headaches. For example, write down:  What you eat and drink.  How much sleep you get.  Any change to your diet or medicines.  Limit alcohol consumption.  Quit smoking if you smoke.  Get 7-9 hours of sleep, or as recommended by your health care provider.  Limit stress.  Keep lights dim if bright lights bother you and make your migraines worse. SEEK IMMEDIATE MEDICAL CARE IF:   Your migraine becomes severe.  You have a fever.  You have a stiff neck.  You have vision loss.  You have muscular weakness or loss of muscle control.  You start losing your balance or have trouble walking.  You feel faint or pass out.  You have severe symptoms that are different from your first symptoms. MAKE SURE YOU:   Understand these instructions.  Will watch your condition.  Will get help right away if you are not doing well or get worse. Document Released: 07/31/2005 Document Revised: 05/21/2013 Document Reviewed: 04/07/2013 [facility] Patient Information 2015 [place], [place]. This information is not intended to replace advice given to you by your health care provider. Make sure you discuss any questions you have with  your health care provider. ° °

## 2014-02-12 ENCOUNTER — Emergency Department (HOSPITAL_COMMUNITY)
Admission: EM | Admit: 2014-02-12 | Discharge: 2014-02-12 | Disposition: A | Discharge status: Home / Self Care | Payer: BC Managed Care – PPO | Attending: Emergency Medicine | Admitting: Emergency Medicine

## 2014-02-12 ENCOUNTER — Encounter (HOSPITAL_COMMUNITY): Payer: Self-pay | Admitting: Emergency Medicine

## 2014-02-12 DIAGNOSIS — H53149 Visual discomfort, unspecified: Secondary | ICD-10-CM | POA: Insufficient documentation

## 2014-02-12 DIAGNOSIS — K089 Disorder of teeth and supporting structures, unspecified: Secondary | ICD-10-CM | POA: Insufficient documentation

## 2014-02-12 DIAGNOSIS — R519 Headache, unspecified: Secondary | ICD-10-CM

## 2014-02-12 DIAGNOSIS — F411 Generalized anxiety disorder: Secondary | ICD-10-CM | POA: Insufficient documentation

## 2014-02-12 DIAGNOSIS — Z792 Long term (current) use of antibiotics: Secondary | ICD-10-CM | POA: Insufficient documentation

## 2014-02-12 DIAGNOSIS — K0889 Other specified disorders of teeth and supporting structures: Secondary | ICD-10-CM

## 2014-02-12 DIAGNOSIS — Z79899 Other long term (current) drug therapy: Secondary | ICD-10-CM | POA: Insufficient documentation

## 2014-02-12 DIAGNOSIS — R51 Headache: Secondary | ICD-10-CM | POA: Insufficient documentation

## 2014-02-12 MED ORDER — PENICILLIN V POTASSIUM 250 MG PO TABS: 500.0000 mg | ORAL_TABLET | Freq: Four times a day (QID) | ORAL | Status: AC

## 2014-02-12 MED ORDER — KETOROLAC TROMETHAMINE 60 MG/2ML IM SOLN
60.0000 mg | Freq: Once | INTRAMUSCULAR | Status: AC
  Administered 2014-02-12: 60 mg via INTRAMUSCULAR
  Filled 2014-02-12: qty 2

## 2014-02-12 MED ORDER — OXYCODONE-ACETAMINOPHEN 5-325 MG PO TABS: 1.0000 | ORAL_TABLET | Freq: Four times a day (QID) | ORAL | Status: DC | PRN

## 2014-02-12 MED ORDER — HYDROMORPHONE HCL PF 1 MG/ML IJ SOLN
1.0000 mg | Freq: Once | INTRAMUSCULAR | Status: AC
  Administered 2014-02-12: 1 mg via INTRAMUSCULAR
  Filled 2014-02-12: qty 1

## 2014-02-12 NOTE — ED Provider Notes (Signed)
Medical screening examination/treatment/procedure(s) were performed by non-physician practitioner and as supervising physician I was immediately available for consultation/collaboration.  Flint MelterElliott L Dayten Juba, MD 02/12/14 [phone]

## 2014-02-12 NOTE — ED Notes (Signed)
[facility]. Today pt states the migraine pain came back 10 fold. Pt c/o blurred vision and spotty vision. Pt also has dental pain left upper side.

## 2014-02-12 NOTE — ED Provider Notes (Signed)
CSN: [id number]     Arrival date & time 02/12/14  1846 History   First MD Initiated Contact with Patient 02/12/14 1904     Chief Complaint  Patient presents with  . Migraine  . Dental Pain     (Consider location/radiation/quality/duration/timing/severity/associated sxs/prior Treatment) Patient is a 23 y.o. male presenting with migraines and tooth pain. The history is provided by the patient.  Migraine This is a recurrent problem. Associated symptoms include headaches. Pertinent negatives include no chest pain, no abdominal pain and no shortness of breath.  Dental Pain Associated symptoms: headaches    patient presents with recurrent headache. He states began yesterday. He is on the left side of his head going down his left neck. States he also has some pain in that tooth in his left upper jaw. He states the pain in the tooth is gotten worse. He has had some nausea. States he and some chills. He was seen in ER yesterday and had a migraine cocktail some mild relief. He states that the headache returned. No trauma. He states he has some blurriness in his vision. No numbness or weakness. He states he has some flashes in his vision.  Past Medical History  Diagnosis Date  . Anxiety    Past Surgical History  Procedure Laterality Date  . Anterior cruciate ligament repair    . Back surgery     No family history on file. History  Substance Use Topics  . Smoking status: Never Smoker   . Smokeless tobacco: Not on file  . Alcohol Use: Yes    Review of Systems  Constitutional: Negative for activity change and appetite change.  HENT: Positive for dental problem.   Eyes: Positive for photophobia and visual disturbance. Negative for pain.  Respiratory: Negative for chest tightness and shortness of breath.   Cardiovascular: Negative for chest pain and leg swelling.  Gastrointestinal: Positive for nausea. Negative for vomiting, abdominal pain and diarrhea.  Genitourinary: Negative for flank  pain.  Musculoskeletal: Negative for back pain and neck stiffness.  Skin: Negative for rash.  Neurological: Positive for headaches. Negative for weakness and numbness.  Psychiatric/Behavioral: Negative for behavioral problems.      Allergies  Reglan  Home Medications   Prior to Admission medications   Medication Sig Start Date End Date Taking? Authorizing Provider  aspirin-acetaminophen-caffeine (EXCEDRIN MIGRAINE) [phone] MG per tablet Take 2 tablets by mouth every 6 (six) hours as needed for headache.   Yes Historical Provider, MD  diazepam (VALIUM) 5 MG tablet Take 5 mg by mouth at bedtime.    Yes Historical Provider, MD  ibuprofen (ADVIL,MOTRIN) 200 MG tablet Take 800 mg by mouth every 6 (six) hours as needed.   Yes Historical Provider, MD  DULoxetine (CYMBALTA) 30 MG capsule Take 1 capsule (30 mg total) by mouth daily. 03/14/12 03/14/13  Shade FloodJeffrey R Greene, MD  oxyCODONE-acetaminophen (PERCOCET/ROXICET) 5-325 MG per tablet Take 1-2 tablets by mouth every 6 (six) hours as needed for severe pain. 02/12/14   Juliet RudeNathan R. Leibish Mcgregor, MD  penicillin v potassium (VEETID) 250 MG tablet Take 2 tablets (500 mg total) by mouth 4 (four) times daily. 02/12/14 02/19/14  Juliet RudeNathan R. Lauramae Kneisley, MD   BP 138/97  Pulse 66  Temp(Src) 98.3 F (36.8 C) (Oral)  Resp 18  SpO2 98% Physical Exam  Nursing note and vitals reviewed. Constitutional: He is oriented to person, place, and time. He appears well-developed and well-nourished.  HENT:  Head: Normocephalic and atraumatic.  Tender over the left upper  second most posterior tooth no fluctuance over the jaw or gums. No TMJ tenderness.  Eyes: EOM are normal. Pupils are equal, round, and reactive to light.  Neck: Normal range of motion. Neck supple.  No meningeal signs  Cardiovascular: Normal rate, regular rhythm and normal heart sounds.   No murmur heard. Pulmonary/Chest: Effort normal and breath sounds normal.  Abdominal: Soft. Bowel sounds are normal. He  exhibits no distension and no mass. There is no tenderness. There is no rebound and no guarding.  Musculoskeletal: Normal range of motion. He exhibits no edema.  Neurological: He is alert and oriented to person, place, and time. No cranial nerve deficit.  Skin: Skin is warm and dry.  Psychiatric: He has a normal mood and affect.    ED Course  Procedures (including critical care time) Labs Review Labs Reviewed - No data to display  Imaging Review Ct Head Wo Contrast  02/11/2014   CLINICAL DATA:  Headache and blurred vision ; nausea  EXAM: CT HEAD WITHOUT CONTRAST  TECHNIQUE: Contiguous axial images were obtained from the base of the skull through the vertex without intravenous contrast.  COMPARISON:  September 27, 2013  FINDINGS: The ventricles are normal in size and configuration. There is no mass, hemorrhage, extra-axial fluid collection, or midline shift. The gray-white compartments are normal. No acute infarct apparent. Bony calvarium appears intact. The visualized mastoid air cells are clear.  IMPRESSION: Study within normal limits.   Electronically Signed   By: Bretta BangWilliam  Woodruff M.D.   On: 02/11/2014 14:19     EKG Interpretation None      MDM   Final diagnoses:  Pain, dental  Acute nonintractable headache, unspecified headache type    Patient with dental pain. Given pain medicines and antibiotics. No apparent drainable abscess. Headache is improved also. He had CT yesterday.    Juliet RudeNathan R. Rubin PayorPickering, MD 02/13/14 [phone]

## 2014-02-12 NOTE — Discharge Instructions (Signed)

## 2014-02-15 ENCOUNTER — Encounter (HOSPITAL_COMMUNITY): Payer: Self-pay | Admitting: Emergency Medicine

## 2014-02-15 ENCOUNTER — Emergency Department (HOSPITAL_COMMUNITY)
Admission: EM | Admit: 2014-02-15 | Discharge: 2014-02-15 | Disposition: A | Discharge status: Home / Self Care | Payer: BC Managed Care – PPO | Attending: Emergency Medicine | Admitting: Emergency Medicine

## 2014-02-15 DIAGNOSIS — K029 Dental caries, unspecified: Secondary | ICD-10-CM | POA: Diagnosis not present

## 2014-02-15 DIAGNOSIS — Z79899 Other long term (current) drug therapy: Secondary | ICD-10-CM | POA: Insufficient documentation

## 2014-02-15 DIAGNOSIS — H9209 Otalgia, unspecified ear: Secondary | ICD-10-CM | POA: Insufficient documentation

## 2014-02-15 DIAGNOSIS — F411 Generalized anxiety disorder: Secondary | ICD-10-CM | POA: Diagnosis not present

## 2014-02-15 DIAGNOSIS — G43809 Other migraine, not intractable, without status migrainosus: Secondary | ICD-10-CM | POA: Diagnosis not present

## 2014-02-15 DIAGNOSIS — K089 Disorder of teeth and supporting structures, unspecified: Secondary | ICD-10-CM | POA: Insufficient documentation

## 2014-02-15 DIAGNOSIS — R51 Headache: Secondary | ICD-10-CM | POA: Diagnosis present

## 2014-02-15 MED ORDER — DIPHENHYDRAMINE HCL 50 MG/ML IJ SOLN
12.5000 mg | Freq: Once | INTRAMUSCULAR | Status: AC
  Administered 2014-02-15: 12.5 mg via INTRAMUSCULAR
  Filled 2014-02-15: qty 1

## 2014-02-15 MED ORDER — OXYCODONE HCL 5 MG PO TABS
5.0000 mg | ORAL_TABLET | Freq: Once | ORAL | Status: AC
  Administered 2014-02-15: 5 mg via ORAL
  Filled 2014-02-15: qty 1

## 2014-02-15 MED ORDER — PROCHLORPERAZINE EDISYLATE 5 MG/ML IJ SOLN
10.0000 mg | Freq: Once | INTRAMUSCULAR | Status: AC
  Administered 2014-02-15: 10 mg via INTRAVENOUS
  Filled 2014-02-15: qty 2

## 2014-02-15 MED ORDER — SODIUM CHLORIDE 0.9 % IV BOLUS (SEPSIS)
1000.0000 mL | Freq: Once | INTRAVENOUS | Status: AC
  Administered 2014-02-15: 1000 mL via INTRAVENOUS

## 2014-02-15 MED ORDER — OXYCODONE-ACETAMINOPHEN 5-325 MG PO TABS: 1.0000 | ORAL_TABLET | Freq: Four times a day (QID) | ORAL | Status: DC | PRN

## 2014-02-15 MED ORDER — KETOROLAC TROMETHAMINE 30 MG/ML IJ SOLN
30.0000 mg | Freq: Once | INTRAMUSCULAR | Status: AC
  Administered 2014-02-15: 30 mg via INTRAVENOUS
  Filled 2014-02-15: qty 1

## 2014-02-15 NOTE — Discharge Instructions (Signed)
1. Medications: percocet, usual home medications 2. Treatment: rest, drink plenty of fluids,  3. Follow Up: Please followup with your primary doctor and your dentist within 24 hours for discussion of your diagnoses and further evaluation after today's visit;    Dental Pain A tooth ache may be caused by cavities (tooth decay). Cavities expose the nerve of the tooth to air and hot or cold temperatures. It may come from an infection or abscess (also called a boil or furuncle) around your tooth. It is also often caused by dental caries (tooth decay). This causes the pain you are having. DIAGNOSIS  Your caregiver can diagnose this problem by exam. TREATMENT   If caused by an infection, it may be treated with medications which kill germs (antibiotics) and pain medications as prescribed by your caregiver. Take medications as directed.  Only take over-the-counter or prescription medicines for pain, discomfort, or fever as directed by your caregiver.  Whether the tooth ache today is caused by infection or dental disease, you should see your dentist as soon as possible for further care. SEEK MEDICAL CARE IF: The exam and treatment you received today has been provided on an emergency basis only. This is not a substitute for complete medical or dental care. If your problem worsens or new problems (symptoms) appear, and you are unable to meet with your dentist, call or return to this location. SEEK IMMEDIATE MEDICAL CARE IF:   You have a fever.  You develop redness and swelling of your face, jaw, or neck.  You are unable to open your mouth.  You have severe pain uncontrolled by pain medicine. MAKE SURE YOU:   Understand these instructions.  Will watch your condition.  Will get help right away if you are not doing well or get worse. Document Released: 07/31/2005 Document Revised: 10/23/2011 Document Reviewed: 03/18/2008 [facility] Patient Information 2015 [place], [place]. This information is not  intended to replace advice given to you by your health care provider. Make sure you discuss any questions you have with your health care provider.  Migraine Headache A migraine headache is an intense, throbbing pain on one or both sides of your head. A migraine can last for 30 minutes to several hours. CAUSES  The exact cause of a migraine headache is not always known. However, a migraine may be caused when nerves in the brain become irritated and release chemicals that cause inflammation. This causes pain. Certain things may also trigger migraines, such as:  Alcohol.  Smoking.  Stress.  Menstruation.  Aged cheeses.  Foods or drinks that contain nitrates, glutamate, aspartame, or tyramine.  Lack of sleep.  Chocolate.  Caffeine.  Hunger.  Physical exertion.  Fatigue.  Medicines used to treat chest pain (nitroglycerine), birth control pills, estrogen, and some blood pressure medicines. SIGNS AND SYMPTOMS  Pain on one or both sides of your head.  Pulsating or throbbing pain.  Severe pain that prevents daily activities.  Pain that is aggravated by any physical activity.  Nausea, vomiting, or both.  Dizziness.  Pain with exposure to bright lights, loud noises, or activity.  General sensitivity to bright lights, loud noises, or smells. Before you get a migraine, you may get warning signs that a migraine is coming (aura). An aura may include:  Seeing flashing lights.  Seeing bright spots, halos, or zig-zag lines.  Having tunnel vision or blurred vision.  Having feelings of numbness or tingling.  Having trouble talking.  Having muscle weakness. DIAGNOSIS  A migraine headache is  often diagnosed based on:  Symptoms.  Physical exam.  A CT scan or MRI of your head. These imaging tests cannot diagnose migraines, but they can help rule out other causes of headaches. TREATMENT Medicines may be given for pain and nausea. Medicines can also be given to help  prevent recurrent migraines.  HOME CARE INSTRUCTIONS  Only take over-the-counter or prescription medicines for pain or discomfort as directed by your health care provider. The use of long-term narcotics is not recommended.  Lie down in a dark, quiet room when you have a migraine.  Keep a journal to find out what may trigger your migraine headaches. For example, write down:  What you eat and drink.  How much sleep you get.  Any change to your diet or medicines.  Limit alcohol consumption.  Quit smoking if you smoke.  Get 7-9 hours of sleep, or as recommended by your health care provider.  Limit stress.  Keep lights dim if bright lights bother you and make your migraines worse. SEEK IMMEDIATE MEDICAL CARE IF:   Your migraine becomes severe.  You have a fever.  You have a stiff neck.  You have vision loss.  You have muscular weakness or loss of muscle control.  You start losing your balance or have trouble walking.  You feel faint or pass out.  You have severe symptoms that are different from your first symptoms. MAKE SURE YOU:   Understand these instructions.  Will watch your condition.  Will get help right away if you are not doing well or get worse. Document Released: 07/31/2005 Document Revised: 05/21/2013 Document Reviewed: 04/07/2013 [facility] Patient Information 2015 [place], [place]. This information is not intended to replace advice given to you by your health care provider. Make sure you discuss any questions you have with your health care provider.

## 2014-02-15 NOTE — ED Provider Notes (Signed)
Medical screening examination/treatment/procedure(s) were performed by non-physician practitioner and as supervising physician I was immediately available for consultation/collaboration.   EKG Interpretation None       Ethelda ChickMartha K Linker, MD 02/15/14 [phone]

## 2014-02-15 NOTE — ED Notes (Signed)
Patient is alert and oriented x3.  He was given DC instructions and follow up visit instructions.  Patient gave verbal understanding.  He was DC ambulatory under his own power to home.  V/S stable.  He was not showing any signs of distress on DC 

## 2014-02-15 NOTE — ED Provider Notes (Signed)
CSN: [id number]     Arrival date & time 02/15/14  1621 History   First MD Initiated Contact with Patient 02/15/14 1708     Chief Complaint  Patient presents with  . Migraine     (Consider location/radiation/quality/duration/timing/severity/associated sxs/prior Treatment) The history is provided by the patient and medical records. No language interpreter was used.    Brian [name] is a 23 y.o. male  with a hx of anxiety presents to the Emergency Department complaining of gradual, persistent, progressively worsening headache and left upper molar dental pain onset 02/11/14.  Pt has been seen twice here for migraine headache and given PCN and percocet.  Pt reports the percocet provides temporary relief, but he is out of this and the pain is returning. Pt reports last percocet at 3:30PM.  He reports he has a Education officer, communitydentist, but they were closed Friday and he has been unable to contact them as it is the weekend.  He plans to see them tomorrow.  Associated symptoms include throbbing headache.  Percocet makes it better and brushing teeth and cold drinks makes it worse.  Pt denies fever, chills, neck pain, neck stiffness, CP, SOB, abd pain, N/V/D, weakness, dizziness.  Pt reports his headache is consistent with previous migraines and the initial migraine cocktail alleviated the headache, but the tooth pain persisted.     Past Medical History  Diagnosis Date  . Anxiety    Past Surgical History  Procedure Laterality Date  . Anterior cruciate ligament repair    . Back surgery     No family history on file. History  Substance Use Topics  . Smoking status: Never Smoker   . Smokeless tobacco: Not on file  . Alcohol Use: Yes    Review of Systems  Constitutional: Negative for fever, chills, diaphoresis, appetite change, fatigue and unexpected weight change.  HENT: Positive for dental problem and ear pain (left). Negative for drooling, facial swelling, mouth sores, nosebleeds, postnasal drip, rhinorrhea and  trouble swallowing.   Eyes: Negative for pain, redness and visual disturbance.  Respiratory: Negative for cough, chest tightness, shortness of breath and wheezing.   Cardiovascular: Negative for chest pain.  Gastrointestinal: Negative for nausea, vomiting, abdominal pain, diarrhea and constipation.  Endocrine: Negative for polydipsia, polyphagia and polyuria.  Genitourinary: Negative for dysuria, urgency, frequency and hematuria.  Musculoskeletal: Negative for back pain, neck pain and neck stiffness.  Skin: Negative for color change and rash.  Allergic/Immunologic: Negative for immunocompromised state.  Neurological: Positive for headaches. Negative for syncope, weakness and light-headedness.  Hematological: Does not bruise/bleed easily.  Psychiatric/Behavioral: Negative for sleep disturbance. The patient is not nervous/anxious.   All other systems reviewed and are negative.     Allergies  Reglan  Home Medications   Prior to Admission medications   Medication Sig Start Date End Date Taking? Authorizing Provider  aspirin-acetaminophen-caffeine (EXCEDRIN MIGRAINE) [id number] MG per tablet Take 2 tablets by mouth every 6 (six) hours as needed for headache.   Yes Historical Provider, MD  diazepam (VALIUM) 5 MG tablet Take 5 mg by mouth at bedtime.    Yes Historical Provider, MD  ibuprofen (ADVIL,MOTRIN) 200 MG tablet Take 800 mg by mouth every 6 (six) hours as needed.   Yes Historical Provider, MD  penicillin v potassium (VEETID) 250 MG tablet Take 2 tablets (500 mg total) by mouth 4 (four) times daily. 02/12/14 02/19/14 Yes Nathan R. Pickering, MD  oxyCODONE-acetaminophen (PERCOCET/ROXICET) 5-325 MG per tablet Take 1-2 tablets by mouth every 6 (six) hours  as needed for severe pain. 02/15/14   Radford Pease, PA-C   BP 159/81  Pulse 76  Temp(Src) 98.4 F (36.9 C)  Resp 16  SpO2 100% Physical Exam  Nursing note and vitals reviewed. Constitutional: He is oriented to person, place, and  time. He appears well-developed and well-nourished. No distress.  HENT:  Head: Normocephalic and atraumatic.  Right Ear: Tympanic membrane, external ear and ear canal normal.  Left Ear: Tympanic membrane, external ear and ear canal normal.  Nose: Nose normal. Right sinus exhibits no maxillary sinus tenderness and no frontal sinus tenderness. Left sinus exhibits no maxillary sinus tenderness and no frontal sinus tenderness.  Mouth/Throat: Uvula is midline, oropharynx is clear and moist and mucous membranes are normal. No oral lesions. No uvula swelling, lacerations or dental caries. No oropharyngeal exudate, posterior oropharyngeal edema, posterior oropharyngeal erythema or tonsillar abscesses.    Eyes: Conjunctivae and EOM are normal. Pupils are equal, round, and reactive to light. Right eye exhibits no discharge. Left eye exhibits no discharge. No scleral icterus.  No horizontal, vertical or rotational nystagmus  Neck: Normal range of motion. Neck supple.  Full active and passive ROM without pain No midline or paraspinal tenderness No nuchal rigidity or meningeal signs  Cardiovascular: Normal rate, regular rhythm, normal heart sounds and intact distal pulses.   Pulmonary/Chest: Effort normal and breath sounds normal. No respiratory distress. He has no wheezes. He has no rales.  Abdominal: Soft. Bowel sounds are normal. He exhibits no distension. There is no tenderness. There is no rebound and no guarding.  Musculoskeletal: Normal range of motion.  Lymphadenopathy:    He has no cervical adenopathy.  Neurological: He is alert and oriented to person, place, and time. He has normal reflexes. No cranial nerve deficit. He exhibits normal muscle tone. Coordination normal.  Mental Status:  Alert, oriented, thought content appropriate. Speech fluent without evidence of aphasia. Able to follow 2 step commands without difficulty.  Cranial Nerves:  II:  Peripheral visual fields grossly normal, pupils  equal, round, reactive to light III,IV, VI: ptosis not present, extra-ocular motions intact bilaterally  V,VII: smile symmetric, facial light touch sensation equal VIII: hearing grossly normal bilaterally  IX,X: gag reflex present  XI: bilateral shoulder shrug equal and strong XII: midline tongue extension  Motor:  5/5 in upper and lower extremities bilaterally including strong and equal grip strength and dorsiflexion/plantar flexion Sensory: Pinprick and light touch normal in all extremities.  Deep Tendon Reflexes: 2+ and symmetric  Cerebellar: normal finger-to-nose with bilateral upper extremities Gait: normal gait and balance CV: distal pulses palpable throughout   Skin: Skin is warm and dry. No rash noted. He is not diaphoretic.  Psychiatric: He has a normal mood and affect. His behavior is normal. Judgment and thought content normal.    ED Course  Procedures (including critical care time) Labs Review Labs Reviewed - No data to display  Imaging Review No results found.   EKG Interpretation None      MDM   Final diagnoses:  Pain due to dental caries  Other type of migraine   Brian Lek presents with migraine headache and dental pain.  Pt HA treated and improved while in ED.  Presentation is like pts typical HA and non concerning for [facility], ICH, Meningitis, or temporal arteritis. Pt is afebrile with no focal neuro deficits, nuchal rigidity, or change in vision. Patient had a CT scan of the head on 02/11/2014 which was without acute abnormality.  Patient with  toothache.  No gross abscess.  Exam unconcerning for Ludwig's angina or spread of infection.  Patient started taking penicillin and is out of his pain medication.  Will refill pain medication today.  Urged patient to follow-up with dentist within 24 hours.  I have personally reviewed patient's vitals, nursing note and any pertinent labs or imaging.  I performed an undressed physical exam.    At this time, it has been  determined that no acute conditions requiring further emergency intervention. The patient/guardian have been advised of the diagnosis and plan. I reviewed all labs and imaging including any potential incidental findings. We have discussed signs and symptoms that warrant return to the ED, such as changes in vision, high fevers or worsening symptoms.  Patient/guardian has voiced understanding and agreed to follow-up with the PCP or specialist in 24 hours.  Vital signs are stable at discharge.   BP 159/81  Pulse 76  Temp(Src) 98.4 F (36.9 C)  Resp 16  SpO2 100%           Dierdre ForthHannah Hearl Heikes, PA-C 02/15/14 1923

## 2014-02-15 NOTE — ED Notes (Signed)
Patient was seen here on Thursday for toothache and headache. Patient states he took the medications that was given to him but he is out. Patient c/o worsening HA with sensitivity to light. Patient reports nausea but no vomiting.

## 2014-02-15 NOTE — ED Notes (Signed)
Pt seen on 6/1 and 6/2 for same complaint, migraine and dental pain. CT performed already. Pt on 6/2 given 15 percocet tablets, took last tablet earlier today. Migraine 4/10 pain right now. Pt reports all dental offices have been closed for the holiday. Pt reports sensitivity to light

## 2014-02-15 NOTE — ED Notes (Signed)
Pt alert and oriented x4. Respirations even and unlabored, bilateral symmetrical rise and fall of chest. Skin warm and dry. In no acute distress. Denies needs.   

## 2015-08-11 ENCOUNTER — Other Ambulatory Visit: Payer: Self-pay | Admitting: Occupational Medicine

## 2015-08-11 ENCOUNTER — Ambulatory Visit
Admission: RE | Admit: 2015-08-11 | Discharge: 2015-08-11 | Disposition: A | Discharge status: Home / Self Care | Payer: No Typology Code available for payment source | Source: Ambulatory Visit | Attending: Occupational Medicine | Admitting: Occupational Medicine

## 2015-08-11 DIAGNOSIS — Z021 Encounter for pre-employment examination: Secondary | ICD-10-CM

## 2015-10-02 ENCOUNTER — Emergency Department (HOSPITAL_COMMUNITY)
Admission: EM | Admit: 2015-10-02 | Discharge: 2015-10-02 | Disposition: A | Discharge status: Home / Self Care | Payer: BC Managed Care – PPO | Attending: Emergency Medicine | Admitting: Emergency Medicine

## 2015-10-02 ENCOUNTER — Encounter (HOSPITAL_COMMUNITY): Payer: Self-pay

## 2015-10-02 DIAGNOSIS — G4700 Insomnia, unspecified: Secondary | ICD-10-CM | POA: Insufficient documentation

## 2015-10-02 DIAGNOSIS — F419 Anxiety disorder, unspecified: Secondary | ICD-10-CM

## 2015-10-02 DIAGNOSIS — Z79899 Other long term (current) drug therapy: Secondary | ICD-10-CM | POA: Insufficient documentation

## 2015-10-02 MED ORDER — ZOLPIDEM TARTRATE 5 MG PO TABS: 5.0000 mg | ORAL_TABLET | Freq: Every evening | ORAL | Status: DC | PRN

## 2015-10-02 NOTE — ED Notes (Signed)
Pt presents with c/o anxiety that has been present for approx 3 weeks, hx of same. Also c/o insomnia, unable to sleep for the past three days. Pt denies any SI/HI at this time.

## 2015-10-02 NOTE — ED Provider Notes (Signed)
CSN: [id number]     Arrival date & time 10/02/15  1244 History   First MD Initiated Contact with Patient 10/02/15 1311     Chief Complaint  Patient presents with  . Anxiety  . Insomnia     (Consider location/radiation/quality/duration/timing/severity/associated sxs/prior Treatment) HPI   Brian [name] is a 25 y.o. male who presents for evaluation of difficulty sleeping for several weeks, a generalized feeling on is and nervousness, and difficulty concentrating. Symptoms have worsened over the last several months. In the past. He was seeing a primary care doctor, and a therapist for anxiety, and was treated with Valium. 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 He has difficulty going to sleep, and frequently wakes up after sleeping just a couple of hours. He uses an over-the-counter diphenhydramine product to help him sleep, with minimal results. He denies fever, chills, nausea, vomiting, weakness or dizziness. There are no other known modifying factors.  Past Medical History  Diagnosis Date  . Anxiety    Past Surgical History  Procedure Laterality Date  . Anterior cruciate ligament repair    . Back surgery     No family history on file. Social History  Substance Use Topics  . Smoking status: Never Smoker   . Smokeless tobacco: None  . Alcohol Use: Yes    Review of Systems  All other systems reviewed and are negative.     Allergies  Reglan  Home Medications   Prior to Admission medications   Medication Sig Start Date End Date Taking? Authorizing Provider  aspirin-acetaminophen-caffeine (EXCEDRIN MIGRAINE) [id number] MG per tablet Take 2 tablets by mouth every 6 (six) hours as needed for headache.    Historical Provider, MD  diazepam (VALIUM) 5 MG tablet Take 5 mg by mouth at bedtime.     Historical Provider, MD  ibuprofen (ADVIL,MOTRIN) 200 MG tablet Take 800 mg by mouth  every 6 (six) hours as needed.    Historical Provider, MD  oxyCODONE-acetaminophen (PERCOCET/ROXICET) 5-325 MG per tablet Take 1-2 tablets by mouth every 6 (six) hours as needed for severe pain. 02/15/14   Hannah Muthersbaugh, PA-C   BP 151/83 mmHg  Pulse 82  Temp(Src) 97.9 F (36.6 C) (Oral)  Resp 20  SpO2 100% Physical Exam  Constitutional: He is oriented to person, place, and time. He appears well-developed and well-nourished.  HENT:  Head: Normocephalic and atraumatic.  Right Ear: External ear normal.  Left Ear: External ear normal.  Eyes: Conjunctivae and EOM are normal. Pupils are equal, round, and reactive to light.  Neck: Normal range of motion and phonation normal. Neck supple.  Cardiovascular: Normal rate, regular rhythm and normal heart sounds.   Pulmonary/Chest: Effort normal and breath sounds normal. He exhibits no bony tenderness.  Abdominal: Soft. There is no tenderness.  Musculoskeletal: Normal range of motion.  Neurological: He is alert and oriented to person, place, and time. No cranial nerve deficit or sensory deficit. He exhibits normal muscle tone. Coordination normal.  Skin: Skin is warm, dry and intact.  Psychiatric: His behavior is normal. Judgment and thought content normal.  Mildly anxious  Nursing note and vitals reviewed.   ED Course  Procedures (including critical care time)  Medications - No data to display  Patient Vitals for the past 24 hrs:  BP Temp Temp src Pulse Resp SpO2  10/02/15 1249 151/83 mmHg 97.9 F (36.6 C) Oral 82  20 100 %    1:27 PM Reevaluation with update and discussion. After initial assessment and treatment, an updated evaluation reveals no change in clinical status. Findings discussed with the patient, all questions were answered. Jamesa Tedrick L    Labs Review Labs Reviewed - No data to display  Imaging Review No results found. I have personally reviewed and evaluated these images and lab results as part of my medical  decision-making.   EKG Interpretation None      MDM   Final diagnoses:  Anxiety  Insomnia    Anxiety with insomnia. Patient with stress, schooling and baseline anxiety disorder, which is not currently being treated. Doubt acute unstable psychiatric condition, CNS or cardiac abnormality or metabolic instability.   Nursing Notes Reviewed/ Care Coordinated Applicable Imaging Reviewed Interpretation of Laboratory Data incorporated into ED treatment  The patient appears reasonably screened and/or stabilized for discharge and I doubt any other medical condition or other [facility] requiring further screening, evaluation, or treatment in the ED at this time prior to discharge.  Plan: Home Medications- Ambien prn for 2 weeks; Home Treatments- rest; return here if the recommended treatment, does not improve the symptoms; Recommended follow up- PCP and Therapist of choice asap   Mancel Bale, MD 10/02/15 1712

## 2015-10-02 NOTE — Discharge Instructions (Signed)
Follow-up with your primary care doctor, and a therapist as soon as possible for treatment of anxiety and difficulty sleeping    Generalized Anxiety Disorder Generalized anxiety disorder (GAD) is a mental disorder. It interferes with life functions, including relationships, work, and school. GAD is different from normal anxiety, which everyone experiences at some point in their lives in response to specific life events and activities. Normal anxiety actually helps [place] prepare for and get through these life events and activities. Normal anxiety goes away after the event or activity is over.  GAD causes anxiety that is not necessarily related to specific events or activities. It also causes excess anxiety in proportion to specific events or activities. The anxiety associated with GAD is also difficult to control. GAD can vary from mild to severe. People with severe GAD can have intense waves of anxiety with physical symptoms (panic attacks).  SYMPTOMS The anxiety and worry associated with GAD are difficult to control. This anxiety and worry are related to many life events and activities and also occur more days than not for 6 months or longer. People with GAD also have three or more of the following symptoms (one or more in children):  Restlessness.   Fatigue.  Difficulty concentrating.   Irritability.  Muscle tension.  Difficulty sleeping or unsatisfying sleep. DIAGNOSIS GAD is diagnosed through an assessment by your health care provider. Your health care provider will ask you questions aboutyour mood,physical symptoms, and events in your life. Your health care provider may ask you about your medical history and use of alcohol or drugs, including prescription medicines. Your health care provider may also do a physical exam and blood tests. Certain medical conditions and the use of certain substances can cause symptoms similar to those associated with GAD. Your health care provider may refer  you to a mental health specialist for further evaluation. TREATMENT The following therapies are usually used to treat GAD:   Medication. Antidepressant medication usually is prescribed for long-term daily control. Antianxiety medicines may be added in severe cases, especially when panic attacks occur.   Talk therapy (psychotherapy). Certain types of talk therapy can be helpful in treating GAD by providing support, education, and guidance. A form of talk therapy called cognitive behavioral therapy can teach you healthy ways to think about and react to daily life events and activities.  Stress managementtechniques. These include yoga, meditation, and exercise and can be very helpful when they are practiced regularly. A mental health specialist can help determine which treatment is best for you. Some people see improvement with one therapy. However, other people require a combination of therapies.   This information is not intended to replace advice given to you by your health care provider. Make sure you discuss any questions you have with your health care provider.   Document Released: 11/25/2012 Document Revised: 08/21/2014 Document Reviewed: 11/25/2012 Elsevier Interactive Patient Education 2016 [employer].   [employer] [facility] Resources in the [employer]  Intensive Outpatient Programs: [facility]      [address] [place], [place] [phone] Both a day and evening program       [facility] Outpatient     [address]        [place], [place] [address] [phone]         ADS: [facility] [address] [place] [phone]  [facility] Mental Health ACCESS LINE: [phone] or [phone] [address] [place], [place]  [address] [email or web address]  Mobile Crisis Teams:                                        [facility]         Mobile Crisis Care  Unit [phone]             [facility] [address] Ginette Otto [phone]                                         [facility] [address] [place] [phone]  Self-Help/Support Groups: [facility] [employer] of support groups [phone] (call for more info)  Narcotics Anonymous (NA) [facility] [address] [place] - 2 meetings at this location  Residential Treatment Programs:  [facility]      [address]        [place]       [phone]         [facility] [address] [place] [address] [place], [place]  [address] [phone]  [facility]  [address] [place], [place] [address] [phone] Admissions: 8am-3pm M-F  [facility]     [address]        [place], [place] [address]       [phone]         [facility] [address] [place], [place] [phone]  [facility] [address] [place], [place] [phone]  [facility] - Intensive Outpatient      [address]     [place], [place]       [phone]         [facility] ([facility]     [address] [place], [place] [phone] or [phone]  [facility] ([facility])  [address] [place], [place] [phone]  [facility] [address]                                               [address] [place] [phone]  [facility] Resources: [phone]               General Therapy                                                Angie Fava, PhD        [address] [place], [place] [address]         [phone]   Insurance  [facility] Behavioral   [address] [place], [place] [address] [phone]  [facility] Recovery [address]  [place], [place] [address] [phone] Insurance/Medicaid/sponsorship through [facility]  [address]                                        [place], [place] [address]    Therapy/tele-psych/case         [phone]          [facility] [address], [place]  [address]  Adolescent/group home/case management [phone]                                           Creola Corn PhD       General therapy       Insurance   [phone]         Dr. Lolly Mustache Insurance [phone] M-F  [facility] Medicaid, sponsorship [phone]

## 2015-10-03 ENCOUNTER — Ambulatory Visit (INDEPENDENT_AMBULATORY_CARE_PROVIDER_SITE_OTHER): Payer: Self-pay | Admitting: Emergency Medicine

## 2015-10-03 VITALS — BP 140/82 | HR 80 | Temp 98.1°F | Resp 16 | Ht 72.0 in | Wt 277.0 lb

## 2015-10-03 DIAGNOSIS — R002 Palpitations: Secondary | ICD-10-CM | POA: Insufficient documentation

## 2015-10-03 DIAGNOSIS — F410 Panic disorder [episodic paroxysmal anxiety] without agoraphobia: Secondary | ICD-10-CM | POA: Insufficient documentation

## 2015-10-03 DIAGNOSIS — N62 Hypertrophy of breast: Secondary | ICD-10-CM

## 2015-10-03 MED ORDER — PAROXETINE HCL 20 MG PO TABS
20.0000 mg | ORAL_TABLET | Freq: Every day | ORAL | Status: DC
Start: 2015-10-03 — End: 2015-11-12

## 2015-10-03 MED ORDER — DIAZEPAM 5 MG PO TABS: ORAL_TABLET | ORAL | Status: DC

## 2015-10-03 NOTE — Progress Notes (Signed)
Chief Complaint:  Chief Complaint  Patient presents with  . Panic Attack    x 3 days    HPI: Brian [name] is a 25 y.o. male who is here for evaluation of panic attacks. He has a history of these in the distant past. He currently is going through Geographical information systems officer. He has a regular partner with 2 children. Financially he is having significant difficulties. He states that about twice a day he has episodes where he gets a ringing in his ears becomes afraid. He feels rapid heartbeat and a sensation he must get out of the room. These episodes are occurring approximately twice a day and last about 15 minutes. He has no history of cardiac or thyroid disease. 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    Past Medical History  Diagnosis Date  . Anxiety    Past Surgical History  Procedure Laterality Date  . Anterior cruciate ligament repair    . Back surgery     Social History   Social History  . Marital Status: Single    Spouse Name: N/A  . Number of Children: N/A  . Years of Education: N/A   Social History Main Topics  . Smoking status: Never Smoker   . Smokeless tobacco: None  . Alcohol Use: Yes  . Drug Use: No  . Sexual Activity: Not Asked   Other Topics Concern  . None   Social History Narrative   No family history on file. Allergies  Allergen Reactions  . Reglan [Metoclopramide] Other (See Comments)    Sweating and hot sensation in jaws an genital area    Prior to Admission medications   Medication Sig Start Date End Date Taking? Authorizing Provider  aspirin-acetaminophen-caffeine (EXCEDRIN MIGRAINE) [phone] MG per tablet Take 2 tablets by mouth every 6 (six) hours as needed for headache. Reported on 10/03/2015    Historical Provider, MD  diazepam (VALIUM) 5 MG tablet Take 5 mg by mouth at bedtime. Reported on 10/03/2015    Historical Provider, MD  ibuprofen (ADVIL,MOTRIN) 200 MG tablet Take 800 mg by mouth every 6 (six) hours as  needed. Reported on 10/03/2015    Historical Provider, MD  oxyCODONE-acetaminophen (PERCOCET/ROXICET) 5-325 MG per tablet Take 1-2 tablets by mouth every 6 (six) hours as needed for severe pain. Patient not taking: Reported on 10/03/2015 02/15/14   Dahlia Client Muthersbaugh, PA-C  zolpidem (AMBIEN) 5 MG tablet Take 1 tablet (5 mg total) by mouth at bedtime as needed for sleep. Patient not taking: Reported on 10/03/2015 10/02/15   Mancel Bale, MD     ROS: The patient denies fevers, chills, night sweats, unintentional weight loss, chest pain, palpitations, wheezing, dyspnea on exertion, nausea, vomiting, abdominal pain, dysuria, hematuria, melena, numbness, weakness, or tingling.   All other systems have been reviewed and were otherwise negative with the exception of those mentioned in the HPI and as above.    PHYSICAL EXAM: Filed Vitals:   10/03/15 1040  BP: 140/82  Pulse: 80  Temp: 98.1 F (36.7 C)  Resp: 16   Filed Vitals:   10/03/15 1040  Height: 6' (1.829 m)  Weight: 277 lb (125.646 kg)   Body mass index is 37.56 kg/(m^2).   General: Alert, no acute distress HEENT:  Normocephalic, atraumatic, oropharynx patent. EOMI, PERRLA Cardiovascular:  Regular rate and rhythm, no rubs murmurs or gallops.  No Carotid bruits, radial pulse intact. No pedal edema.  Respiratory: Clear to auscultation  bilaterally.  No wheezes, rales, or rhonchi.  No cyanosis, no use of accessory musculature GI: No organomegaly, abdomen is soft and non-tender, positive bowel sounds.  No masses. Skin: No rashes. Patient has significant bilateral gynecomastia. Neurologic: Facial musculature symmetric. Psychiatric: Patient is appropriate throughout our interaction. Lymphatic: No cervical lymphadenopathy Musculoskeletal: Gait intact.   LABS: Results for orders placed or performed during the hospital encounter of 02/11/14  Sedimentation rate  Result Value Ref Range   Sed Rate 9 0 - 16 mm/hr  CBC with Differential    Result Value Ref Range   WBC 7.5 4.0 - 10.5 K/uL   RBC 5.13 4.22 - 5.81 MIL/uL   Hemoglobin 15.0 13.0 - 17.0 g/dL   HCT 16.1 09.6 - 04.5 %   MCV 88.3 78.0 - 100.0 fL   MCH 29.2 26.0 - 34.0 pg   MCHC 33.1 30.0 - 36.0 g/dL   RDW 40.9 81.1 - 91.4 %   Platelets 246 150 - 400 K/uL   Neutrophils Relative % 64 43 - 77 %   Neutro Abs 4.8 1.7 - 7.7 K/uL   Lymphocytes Relative 25 12 - 46 %   Lymphs Abs 1.9 0.7 - 4.0 K/uL   Monocytes Relative 10 3 - 12 %   Monocytes Absolute 0.7 0.1 - 1.0 K/uL   Eosinophils Relative 1 0 - 5 %   Eosinophils Absolute 0.1 0.0 - 0.7 K/uL   Basophils Relative 0 0 - 1 %   Basophils Absolute 0.0 0.0 - 0.1 K/uL  Basic metabolic panel  Result Value Ref Range   Sodium 141 137 - 147 mEq/L   Potassium 4.2 3.7 - 5.3 mEq/L   Chloride 101 96 - 112 mEq/L   CO2 28 19 - 32 mEq/L   Glucose, Bld 87 70 - 99 mg/dL   BUN 15 6 - 23 mg/dL   Creatinine, Ser 7.82 0.50 - 1.35 mg/dL   Calcium 9.3 8.4 - 95.6 mg/dL   GFR calc non Af Amer >90 >90 mL/min   GFR calc Af Amer >90 >90 mL/min   Anion gap 12 5 - 15     EKG/XRAY:   Primary read interpreted by Dr. Cleta Alberts at [facility].Sinus bradycardia no acute disease.   ASSESSMENT/PLAN: Patient having what sounds like panic attacks approximately twice a day lasting 15 minutes. He is under a lot of stress related to his financial situation and currently undergoing firefighter training. 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 He would like to see a psychiatrist to discuss these findings but his insurance does not take effect until March 1. We'll start the patient on Paxil 20 mg 1 a day and he was given #10 Valium 5 mg to take as needed for panic episodes. Referral made to Dr. Avondale Sink to be seen and evaluated once his insurance takes effect. Also of note patient has significant gynecomastia on exam. He states he has had this evaluated in the past but it has been 3-4 years. He is agreeable to come in once his insurance takes effect and go  through other blood work and possibly an MRI to further evaluate this. Lucilla Edin M.D.   Gross sideeffects, risk and benefits, and alternatives of medications d/w patient. Patient is aware that all medications have potential sideeffects and we are unable to predict every sideeffect or drug-drug interaction that may occur.  @ 10/03/2015 11:40 AM

## 2015-10-03 NOTE — Patient Instructions (Signed)

## 2015-10-05 ENCOUNTER — Telehealth: Payer: Self-pay

## 2015-10-05 NOTE — Telephone Encounter (Signed)
Pt wants Dr. Cleta Alberts to know that the diazepam (VALIUM) 5 MG tablet [[id number]]  Makes him feel really sluggish, moody & just sad. He's not sure if there is something else that he can take or not. Please call patient back with what he needs to do.

## 2015-10-07 NOTE — Telephone Encounter (Signed)
Dr. Daub  Please see previous message 

## 2015-10-08 ENCOUNTER — Telehealth: Payer: Self-pay | Admitting: Emergency Medicine

## 2015-10-08 ENCOUNTER — Other Ambulatory Visit: Payer: Self-pay | Admitting: Emergency Medicine

## 2015-10-08 DIAGNOSIS — F411 Generalized anxiety disorder: Secondary | ICD-10-CM

## 2015-10-08 MED ORDER — ALPRAZOLAM 0.25 MG PO TABS: ORAL_TABLET | ORAL | Status: DC

## 2015-10-08 NOTE — Telephone Encounter (Signed)
I called patient and had to leave a message on his phone regarding his medications. I asked him to call [phone] to discuss his situation.

## 2015-10-08 NOTE — Telephone Encounter (Signed)
Faxed in alprazolam Rx for pt.

## 2015-10-08 NOTE — Telephone Encounter (Signed)
See notes under 2/24 ph message from Dr Cleta Alberts.

## 2015-11-03 ENCOUNTER — Other Ambulatory Visit: Payer: Self-pay | Admitting: Emergency Medicine

## 2015-11-06 ENCOUNTER — Telehealth: Payer: Self-pay | Admitting: *Deleted

## 2015-11-06 NOTE — Telephone Encounter (Signed)
Faxed in

## 2015-11-12 ENCOUNTER — Other Ambulatory Visit: Payer: Self-pay

## 2015-11-12 MED ORDER — PAROXETINE HCL 20 MG PO TABS: 20.0000 mg | ORAL_TABLET | Freq: Every day | ORAL | Status: DC

## 2015-11-22 ENCOUNTER — Other Ambulatory Visit: Payer: Self-pay | Admitting: Emergency Medicine

## 2015-11-23 NOTE — Telephone Encounter (Signed)
Faxed

## 2015-12-01 ENCOUNTER — Ambulatory Visit (HOSPITAL_COMMUNITY): Payer: Self-pay | Admitting: Psychiatry

## 2015-12-02 ENCOUNTER — Ambulatory Visit
Admission: RE | Admit: 2015-12-02 | Discharge: 2015-12-02 | Disposition: A | Discharge status: Home / Self Care | Payer: Worker's Compensation | Source: Ambulatory Visit | Attending: Nurse Practitioner | Admitting: Nurse Practitioner

## 2015-12-02 ENCOUNTER — Other Ambulatory Visit: Payer: Self-pay | Admitting: Nurse Practitioner

## 2015-12-02 DIAGNOSIS — R52 Pain, unspecified: Secondary | ICD-10-CM

## 2015-12-13 ENCOUNTER — Other Ambulatory Visit: Payer: Self-pay | Admitting: Emergency Medicine

## 2015-12-15 NOTE — Telephone Encounter (Signed)
Faxed

## 2016-01-17 ENCOUNTER — Other Ambulatory Visit: Payer: Self-pay | Admitting: Emergency Medicine

## 2016-01-18 ENCOUNTER — Encounter (HOSPITAL_COMMUNITY): Payer: Self-pay | Admitting: Psychiatry

## 2016-01-18 ENCOUNTER — Ambulatory Visit (INDEPENDENT_AMBULATORY_CARE_PROVIDER_SITE_OTHER): Payer: 59 | Admitting: Psychiatry

## 2016-01-18 VITALS — BP 136/88 | HR 53 | Ht 71.0 in | Wt 229.6 lb

## 2016-01-18 DIAGNOSIS — F410 Panic disorder [episodic paroxysmal anxiety] without agoraphobia: Secondary | ICD-10-CM | POA: Diagnosis not present

## 2016-01-18 MED ORDER — PAROXETINE HCL 30 MG PO TABS: 30.0000 mg | ORAL_TABLET | Freq: Every day | ORAL | Status: DC

## 2016-01-18 MED ORDER — CLONAZEPAM 0.5 MG PO TABS: ORAL_TABLET | ORAL | Status: DC

## 2016-01-18 NOTE — Progress Notes (Signed)
[facility] Initial Assessment Note  Brian [name] [id number] 24 y.o.  01/18/2016 10:01 AM  Chief Complaint:  I have panic attacks.  History of Present Illness:  Patient is 25 year old African-American, employed single but living with his girlfriend with 2 kids came for initial evaluation.  Patient was referred from urgent care.  Patient seen twice in the emergency room in past few months due to severe anxiety and panic attack.  Patient told he has a history of panic disorder for more than 10 years but lately he is having these attacks more frequently.  He described these attacks very intense and sometime last more than 15 minutes.  He described having shortness of breath, sweating, ringing in the ears,, racing thoughts, poor sleep, difficulty in concentration and worried about having another attack.  He also endorse feeling very dizzy during the attacks.  He described these attacks happen sometime at home when no one is there.  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  Patient told he wants to get married but he has no finances.  He is worried about his kids who are very small and sometime very demanding.  His girlfriend recently start working and he does not have savings.  Patient denies any paranoia, hallucination, self abusive behavior, anger issues, nightmares, flashback or any suicidal thoughts.  He denies any recent weight loss or weight gain.  He had tried 10 years ago Valium prescribed by her primary care physician and seen therapist at [facility] mental health.  Recently he was seen in urgent care and prescribed Paxil and Xanax.  He feels Paxil helping his anxiety and he does not have these attacks every day.  He is also taking Xanax 0.25 mg prescribed twice a day but he is taking as needed.  Patient denies drinking or using any illegal substances.   Patient denies any OCD, PTSD symptoms.  He has no side effects from Paxil.  Patient currently not seeing any therapist.  Patient denies any feeling of hopelessness worthlessness or any depression.  He denies any crying spells or any suicidal thoughts.  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  He gets home very tired and sometimes does not want to talk to anyone and then decided to have these panic attacks.  Suicidal Ideation: No Plan Formed: No Patient has means to carry out plan: No  Homicidal Ideation: No Plan Formed: No Patient has means to carry out plan: No  Past Psychiatric History/Hospitalization(s): Patient reported history of panic attacks 10 years ago and he was seen by primary care physician Dr Corky Sox. who prescribed Valium which she took for few months.  He also saw a therapist at [facility].  Patient was given Ambien, Xanax and [facility] [place] [facility].  Patient denies any history of psychiatric inpatient treatment, psychosis, suicidal attempt, paranoia, self abusive behavior or any hallucination.  Denies any history of physical, sexual or any verbal abuse. Anxiety: Yes Bipolar Disorder: No Depression: No Mania: No Psychosis: No Schizophrenia: No Personality Disorder: No Hospitalization for psychiatric illness: No History of Electroconvulsive Shock Therapy: No Prior Suicide Attempts: No  Family History; Patient reported grand father from his mother's side has schizophrenia.  Medical History; Patient recently for his right meniscus off his  right knee joint.  He is scheduled to have surgery.  Otherwise he is physically healthy.  He denies any history of seizures, headaches.  His primary care physician is Dr. Corky SoxVeeta Bland.   Traumatic brain injury: Patient denies any history of traumatic brain injury.  Education and Work History; Patient is high school graduate.  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  Psychosocial History; [place]  He has one younger sister.  His parents divorced when he was 25 year old.  Patient has close contact with his parents.  Patient lives with his girlfriend for past 5 years.  He endorsed relationship is going very well.  Together they have 26-year-old child.  Patient has a Museum/gallery conservatorstepdaughter.  Legal History; Patient denies any legal issues.  History Of Abuse; Patient denies any history of abuse.  Substance Abuse History; Patient denies any history of drinking or any illegal substance use.  Review of Systems: Psychiatric: Agitation: No Hallucination: No Depressed Mood: No Insomnia: Yes Hypersomnia: No Altered Concentration: No Feels Worthless: No Grandiose Ideas: No Belief In Special Powers: No New/Increased Substance Abuse: No Compulsions: No  Neurologic: Headache: No Seizure: No Paresthesias: No   Outpatient Encounter Prescriptions as of 01/18/2016  Medication Sig  . aspirin-acetaminophen-caffeine (EXCEDRIN MIGRAINE) 250-250-65 MG per tablet Take 2 tablets by mouth every 6 (six) hours as needed for headache. Reported on 10/03/2015  . clonazePAM (KLONOPIN) 0.5 MG tablet Take 1/2 to 1 tab at bed time  . ibuprofen (ADVIL,MOTRIN) 200 MG tablet Take 800 mg by mouth every 6 (six) hours as needed. Reported on 10/03/2015  . [name] tablet Take 1 tablet (30 mg total) by mouth daily.  . [DISCONTINUED] ALPRAZolam (XANAX) 0.25 MG tablet TAKE 1 TABLET BY MOUTH TWICE A DAY AS NEEDED FOR ANXIETY  . [DISCONTINUED] oxyCODONE-acetaminophen (PERCOCET/ROXICET) 5-325 MG per tablet Take 1-2 tablets by mouth every 6 (six) hours as needed for severe pain. (Patient not taking: Reported on 10/03/2015)  . [DISCONTINUED] PARoxetine (PAXIL) 20 MG tablet Take 1 tablet (20 mg total) by mouth daily.  . [DISCONTINUED] zolpidem (AMBIEN) 5 MG tablet Take 1 tablet (5 mg total) by mouth at  bedtime as needed for sleep. (Patient not taking: Reported on 10/03/2015)  . [DISCONTINUED] 0.9 %  sodium chloride infusion   . [DISCONTINUED] promethazine (PHENERGAN) injection 25 mg    No facility-administered encounter medications on file as of 01/18/2016.    No results found for this or any previous visit (from the past 2160 hour(s)).    Constitutional:  BP 136/88 mmHg  Pulse 53  Ht 5\' 11"  (1.803 m)  Wt 229 lb 9.6 oz (104.146 kg)  BMI 32.04 kg/m2   Musculoskeletal: Strength & Muscle Tone: within normal limits Gait & Station: normal Patient leans: N/A  Psychiatric Specialty Exam: General Appearance: Casual  Eye Contact::  Fair  Speech:  Slow  Volume:  Decreased  Mood:  Anxious  Affect:  Congruent  Thought Process:  Linear  Orientation:  Full (Time, Place, and Person)  Thought Content:  WDL  Suicidal Thoughts:  No  Homicidal Thoughts:  No  Memory:  Immediate;   Good Recent;   Good Remote;   Good  Judgement:  Good  Insight:  Good  Psychomotor Activity:  Normal  Concentration:  Good  Recall:  Good  Fund of Knowledge:  Good  Language:  Good  Akathisia:  No  Handed:  Right  AIMS (if indicated):     Assets:  Communication Skills Desire for Improvement Housing Physical Health Social Support  ADL's:  Intact  Cognition:  WNL  Sleep:        Established Problem, Stable/Improving (1), New problem, with additional work up planned, Review of Psycho-Social Stressors (1), Review or order clinical lab tests (1), Decision to obtain old records (1), Review and summation of old records (2), Established Problem, Worsening (2), New Problem, with no additional work-up planned (3), Review of Medication Regimen & Side Effects (2) and Review of New Medication or Change in Dosage (2)  Assessment: 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  Panic attacks.  Axis II: Deferred  Axis III:  Past Medical History  Diagnosis Date  . Anxiety      Plan:  I review his symptoms,  collateral information, history, current medication and blood work.  Patient is doing better with Paxil 20 mg and Xanax 0.25 mg as needed.  However he is still have panic attacks but they're less intense and less frequent.  I recommended to try Paxil 30 mg daily to help the residual symptoms.  I will also discontinue Xanax due to potential short half-life and abuse and recommended to try [name] half to one tablet to prevent and to help panic attacks.  Discussed benzodiazepine dependence, tolerance, withdrawal and abuse in length.  I will also schedule.  Appointment with a therapist for coping and social skills.  We will get records from his primary care physician including blood work which was done in February.  I recommended to call us back if he has any question, concern or if testing of the symptom.  Discuss safety plan that anytime having active suicidal thoughts or homicidal thoughts and he need to call 911 or go to the local emergency room.  Follow-up in 3 weeks.  Tuwanna Krausz T., MD 01/18/2016

## 2016-01-19 NOTE — Telephone Encounter (Signed)
Called in.

## 2016-01-22 ENCOUNTER — Other Ambulatory Visit: Payer: Self-pay | Admitting: Emergency Medicine

## 2016-01-25 ENCOUNTER — Telehealth: Payer: Self-pay | Admitting: Emergency Medicine

## 2016-01-25 NOTE — Telephone Encounter (Signed)
I have given him 1 more refill. He must come in and talk to me before anymore refills.

## 2016-01-25 NOTE — Telephone Encounter (Signed)
Faxed

## 2016-01-25 NOTE — Telephone Encounter (Signed)
Advised VM.

## 2016-02-08 ENCOUNTER — Ambulatory Visit (HOSPITAL_COMMUNITY): Payer: Self-pay | Admitting: Psychiatry

## 2016-02-28 ENCOUNTER — Encounter (HOSPITAL_COMMUNITY): Payer: Self-pay | Admitting: Clinical

## 2016-02-28 ENCOUNTER — Ambulatory Visit (INDEPENDENT_AMBULATORY_CARE_PROVIDER_SITE_OTHER): Payer: 59 | Admitting: Clinical

## 2016-02-28 DIAGNOSIS — F410 Panic disorder [episodic paroxysmal anxiety] without agoraphobia: Secondary | ICD-10-CM | POA: Diagnosis not present

## 2016-03-01 NOTE — Progress Notes (Signed)
Comprehensive Clinical Assessment (CCA) Note  03/01/2016 Brian [name] [id number]  Visit Diagnosis:      ICD-9-CM ICD-10-CM   1. Panic disorder without agoraphobia 300.01 F41.0       CCA Part One  Part One has been completed on paper by the patient.  (See scanned document in Chart Review)  CCA Part Two [name]  Intake/Chief Complaint:  CCA Intake With Chief Complaint Chief Complaint/Presenting Problem: panic attack in the night or resting Patients Currently Reported Symptoms/Problems: panic attacks daily Individual's Strengths: "I am compassionate, and intelligent" Individual's Preferences: "I want to stop having panic attacks" Type of Services Patient Feels Are Needed: Individual therapy  Mental Health Symptoms Depression:     Mania:     Anxiety:   Anxiety: Difficulty concentrating, Fatigue, Irritability, Restlessness, Sleep, Tension, Worrying (Panic attacks)  Psychosis:  Psychosis: Grossly disornanized or catatonic behavior  Trauma:  Trauma: Re-experience of traumatic event (see [name] lot of traauma and death in work - does not currently meet criteria for PTSD)  Obsessions:  Obsessions: N/[name]  Compulsions:  Compulsions: N/[name]  Inattention:  Inattention: N/[name]  Hyperactivity/Impulsivity:  Hyperactivity/Impulsivity: N/[name]  Oppositional/Defiant Behaviors:  Oppositional/Defiant Behaviors: N/[name]  Borderline Personality:  Emotional Irregularity: N/[name]  Other Mood/Personality Symptoms:      Mental Status Exam Appearance and self-care  Stature:  Stature: Tall  Weight:  Weight: Average weight  Clothing:  Clothing: Casual  Grooming:  Grooming: Normal  Cosmetic use:  Cosmetic Use: None  Posture/gait:  Posture/Gait: Normal  Motor activity:  Motor Activity: Not Remarkable  Sensorium  Attention:  Attention: Normal  Concentration:  Concentration: Normal  Orientation:  Orientation: X5  Recall/memory:  Recall/Memory: Normal  Affect and Mood  Affect:  Affect: Appropriate  Mood:  Mood: Anxious   Relating  Eye contact:  Eye Contact: Normal  Facial expression:  Facial Expression: Responsive  Attitude toward examiner:  Attitude Toward Examiner: Cooperative  Thought and Language  Speech flow: Speech Flow: Normal  Thought content:  Thought Content: Appropriate to mood and circumstances  Preoccupation:     Hallucinations:     Organization:     Company secretary of Knowledge:  Fund of Knowledge: Average  Intelligence:  Intelligence: Average  Abstraction:  Abstraction: Normal  Judgement:  Judgement: Normal  Reality Testing:  Reality Testing: Realistic  Insight:  Insight: Good  Decision Making:  Decision Making: Normal  Social Functioning  Social Maturity:  Social Maturity: Responsible  Social Judgement:  Social Judgement: Normal  Stress  Stressors:  Stressors: Work, Engineer, maintenance (IT):  Coping Ability: Normal  Skill Deficits:     Supports:      Family and Psychosocial History: Family history Marital status: Long term relationship Long term relationship, how long?: 5 years  What types of issues is patient dealing with in the relationship?: I am not [name] present as I could be Are you sexually active?: Yes What is your sexual orientation?: Heterosexual Has your sexual activity been affected by drugs, alcohol, medication, or emotional stress?: No Does patient have children?: Yes How is patient's relationship with their children?: good  Childhood History:  Childhood History By whom was/is the patient raised?: Both parents Additional childhood history information: It was great - Grew up in [place],. Both parents, played [name] lot of sports, Lot of family lot of cousins. Parents were divorced at 2  Description of patient's relationship with caregiver when they were [name] child: My Mother is great  (Maybe calls too much) Father we are much  better it was difficult during the divorce.  Patient's description of current relationship with people who raised him/her: My Mother is  great  (Maybe calls too much) Father we are much better it was difficult during the divorce.  How were you disciplined when you got in trouble as [name] child/adolescent?: Talked to first - then whooping (with open hand)  Does patient have siblings?: Yes Number of Siblings: 1 Description of patient's current relationship with siblings:  Loves sister - she has genetic challenges that make it difficult for her to do relationships well Did patient suffer any verbal/emotional/physical/sexual abuse as [name] child?: No Did patient suffer from severe childhood neglect?: No Has patient ever been sexually abused/assaulted/raped as an adolescent or adult?: No Was the patient ever [name] victim of [name] crime or [name] disaster?: No Witnessed domestic violence?: No Has patient been effected by domestic violence as an adult?: No  CCA Part Two B  Employment/Work Situation: Employment / Work Psychologist, occupational Employment situation: Employed 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 How long has patient been employed?: 9 months Patient's job has been impacted by current illness: No (It affects my home life and sleep) What is the longest time patient has [name] held [name] job?: 4 years  Where was the patient employed at that time?: [employer]  Has patient ever been in the [employer]?: No Are There Guns or Other Weapons in Your Home?: Yes Types of Guns/Weapons: 9 mm pistol Are These Comptroller?: Yes  Education: Education Name of [employer]: [employer] Did Garment/textile technologist From [employer]?: Yes Did Theme park manager?: Yes What Type of College Degree Do you Have?: some college  Did Designer, television/film set?: No Did You Have An Individualized Education Program (IIEP): No Did You Have Any Difficulty At School?: No  Religion: Religion/Spirituality Are You [name] Religious Person?: No  Leisure/Recreation: Leisure / Recreation Leisure and Hobbies: "playing with the kids, used to enjoy  fishing."  Exercise/Diet: Exercise/Diet Do You Exercise?: Yes What Type of Exercise Do You Do?: Other (Comment), Weight Training, Stair Climbing, Run/Walk How Many Times [name] Week Do You Exercise?: 6-7 times [name] week Have You Gained or Lost [name] Significant Amount of Weight in the Past Six Months?: Yes-Lost Number of Pounds Lost?: 25 (Was trying to) Do You Follow [name] Special Diet?: No Do You Have Any Trouble Sleeping?: Yes Explanation of Sleeping Difficulties: Nightmares, night time panic attacks. waking up in the night  CCA Part Two C  Alcohol/Drug Use: Alcohol / Drug Use Pain Medications: See Chart Prescriptions: See Chart Over the Counter: See Chart History of alcohol / drug use?: No history of alcohol / drug abuse                      CCA Part Three  ASAM's:  Six Dimensions of Multidimensional Assessment  Dimension 1:  Acute Intoxication and/or Withdrawal Potential:     Dimension 2:  Biomedical Conditions and Complications:     Dimension 3:  Emotional, Behavioral, or Cognitive Conditions and Complications:     Dimension 4:  Readiness to Change:     Dimension 5:  Relapse, Continued use, or Continued Problem Potential:     Dimension 6:  Recovery/Living Environment:      Substance use Disorder (SUD)    Social Function:  Social Functioning Social Maturity: Responsible Social Judgement: Normal  Stress:  Stress Stressors: Work, Dance movement psychotherapist Ability: Normal Patient Takes Medications The Way The Doctor  Instructed?: Yes Priority Risk: Low Acuity  Risk Assessment- Self-Harm Potential: Risk Assessment For Self-Harm Potential Thoughts of Self-Harm: No current thoughts Method: No plan Availability of Means: No access/NA  Risk Assessment -Dangerous to Others Potential: Risk Assessment For Dangerous to Others Potential Method: No Plan Availability of Means: No access or NA Intent: Vague intent or NA Notification Required: No need or identified person  DSM5  Diagnoses: Patient Active Problem List   Diagnosis Date Noted  . Panic attacks 10/03/2015  . Palpitations 10/03/2015  . Gynecomastia, male 10/03/2015  . Lumbar disc disease 11/01/2011    Patient Centered Plan: Patient is on the following Treatment Plan(s):  Treatment plan to be formulated at next session Individual therapy 1x every 1-2 weeks, sessions to become less frequent as symptoms improve. Follow safety plan as needed  Recommendations for Services/Supports/Treatments: Recommendations for Services/Supports/Treatments Recommendations For Services/Supports/Treatments: Individual Therapy, Medication Management  Treatment Plan Summary:    Referrals to Alternative Service(s): Referred to Alternative Service(s):   Place:   Date:   Time:    Referred to Alternative Service(s):   Place:   Date:   Time:    Referred to Alternative Service(s):   Place:   Date:   Time:    Referred to Alternative Service(s):   Place:   Date:   Time:     Brian [name]

## 2016-03-10 ENCOUNTER — Ambulatory Visit: Payer: 59

## 2016-03-14 ENCOUNTER — Ambulatory Visit (INDEPENDENT_AMBULATORY_CARE_PROVIDER_SITE_OTHER): Payer: 59 | Admitting: Clinical

## 2016-03-14 DIAGNOSIS — F410 Panic disorder [episodic paroxysmal anxiety] without agoraphobia: Secondary | ICD-10-CM | POA: Diagnosis not present

## 2016-03-17 ENCOUNTER — Encounter (HOSPITAL_COMMUNITY): Payer: Self-pay | Admitting: Clinical

## 2016-03-17 NOTE — Progress Notes (Signed)
   THERAPIST PROGRESS NOTE  Session Time: 12:00 -12:55  Participation Level: Active  Behavioral Response: CasualAlertAnxious  Type of Therapy: Individual Therapy  Treatment Goals addressed: Improve psychiatric symptoms, decrease panic attacks, decrease anxiety, discuss traumatic events   Interventions: CBT and Motivational Interviewing psychoeducation,   Summary: [name] is a 25 y.o. male who presents with Panic disorder without agoraphobia .   Suicidal/Homicidal: Nowithout intent/plan  Therapist Response: Glendell Docker met with clinician for an individual therapy. Chandan discussed his psychiatric symptoms and his current life events. Livan shared about his panic attack and anxiety. Wyley shared about some of the stresses of dealing with the trauma he witnesses in his job and how it affects his home life ( has a hard time being engaged and time when anxiety/panic increases). Client and clinician discussed panic attacks as a false alarm. Client and clinician discussed his thoughts and emotions about witnessing people at the lowest points. Michaeljohn shared about identifying with others in their trauma. Client and clinician discussed empathy and healthy boundaries. Clinician asked open ended questions and Ianmichael described returning home. Clinician asked open ended questions and Quamaine was able to identify ways he could better transition home (such as taking off his uniform and doing a physical activity with his kids. Clinician gave Euriah a homework packet on panic attacks which he agreed to complete and bring with him to next session.   Plan: Return again in 1-2 weeks.  Diagnosis: Axis I: Panic disorder without agoraphobia       Derion Kreiter A, LCSW 03/17/2016

## 2016-03-29 ENCOUNTER — Other Ambulatory Visit (HOSPITAL_COMMUNITY): Payer: Self-pay | Admitting: Psychiatry

## 2016-03-29 DIAGNOSIS — F410 Panic disorder [episodic paroxysmal anxiety] without agoraphobia: Secondary | ICD-10-CM

## 2016-03-30 ENCOUNTER — Telehealth: Payer: Self-pay

## 2016-03-30 ENCOUNTER — Other Ambulatory Visit: Payer: Self-pay | Admitting: Physician Assistant

## 2016-03-30 ENCOUNTER — Other Ambulatory Visit (HOSPITAL_COMMUNITY): Payer: Self-pay

## 2016-03-30 ENCOUNTER — Other Ambulatory Visit (HOSPITAL_COMMUNITY): Payer: Self-pay | Admitting: Psychiatry

## 2016-03-30 DIAGNOSIS — F410 Panic disorder [episodic paroxysmal anxiety] without agoraphobia: Secondary | ICD-10-CM

## 2016-03-30 NOTE — Telephone Encounter (Signed)
Noted.  [facility]. Deliah BostonMichael Clark, MS, PA-C 7:34 PM, 03/30/2016

## 2016-03-30 NOTE — Telephone Encounter (Signed)
Brian [name] from Dr Sheela StackArfeen's office/ [facility] called and advised that patient is seeing Dr Lolly MustacheArfeen for psychiatric meds now. He had DCd his alprazolam and started pt on clonazepam in June, and she is asking that we Rx no more psych meds (benzos) for pt. He can only get benzos from their office. Dr Cleta Albertsaub, Lorain ChildesFYI

## 2016-04-03 ENCOUNTER — Other Ambulatory Visit (HOSPITAL_COMMUNITY): Payer: Self-pay

## 2016-04-03 DIAGNOSIS — F410 Panic disorder [episodic paroxysmal anxiety] without agoraphobia: Secondary | ICD-10-CM

## 2016-04-03 MED ORDER — CLONAZEPAM 0.5 MG PO TABS: ORAL_TABLET | ORAL | 0 refills | Status: DC

## 2016-04-03 MED ORDER — PAROXETINE HCL 30 MG PO TABS: 30.0000 mg | ORAL_TABLET | Freq: Every day | ORAL | 0 refills | Status: DC

## 2016-04-20 ENCOUNTER — Ambulatory Visit (HOSPITAL_COMMUNITY): Payer: Self-pay | Admitting: Psychiatry

## 2016-06-09 ENCOUNTER — Other Ambulatory Visit (HOSPITAL_COMMUNITY): Payer: Self-pay | Admitting: Psychiatry

## 2016-06-09 DIAGNOSIS — F410 Panic disorder [episodic paroxysmal anxiety] without agoraphobia: Secondary | ICD-10-CM

## 2016-06-15 ENCOUNTER — Ambulatory Visit (INDEPENDENT_AMBULATORY_CARE_PROVIDER_SITE_OTHER): Payer: 59 | Admitting: Psychiatry

## 2016-06-15 ENCOUNTER — Encounter (HOSPITAL_COMMUNITY): Payer: Self-pay | Admitting: Psychiatry

## 2016-06-15 DIAGNOSIS — F410 Panic disorder [episodic paroxysmal anxiety] without agoraphobia: Secondary | ICD-10-CM

## 2016-06-15 MED ORDER — PAROXETINE HCL 40 MG PO TABS: 40.0000 mg | ORAL_TABLET | Freq: Every day | ORAL | 2 refills | Status: DC

## 2016-06-15 MED ORDER — CLONAZEPAM 0.5 MG PO TABS: ORAL_TABLET | ORAL | 1 refills | Status: DC

## 2016-06-15 NOTE — Progress Notes (Signed)
[facility] [id number] Progress Note  Brian [name] [id number] 25 y.o.  06/15/2016 4:18 PM  Chief Complaint:  I graduated and now I am working.  Job is stressful.   History of Present Illness:  Brian [name] is a 25 year old African-American employed man who was seen first time on June 6 for initial evaluation.  He was referred from urgent care.  He was started on Paxil .  He is taking Paxil 30 mg and [name] half to one tablet as needed.  Though his panic attacks are less intense and less frequent but he continues to feel sometime anxious nervous.  Some nights he has difficulty falling asleep because off racing thoughts, poor attention and concentration.  He endorses job is very stressful.  He seen a lot of emotions and sometime that does not let him sleep.  He is also concerned about his finances.  He lives with his girlfriend and has 2 kids.  Patient is seeing [name] for counseling.  Recently had knee surgery but he is recovering very well.  He is not taking any narcotic pain medication.  Patient denies drinking or using any illegal substances.  He denies any mania, psychosis, hallucination.  He reported no side effects from the psychiatric medication.  He has gained weight more than 20 pounds in past 3 months however his blood pressure and vital signs are normal.   Suicidal Ideation: No Plan Formed: No Patient has means to carry out plan: No  Homicidal Ideation: No Plan Formed: No Patient has means to carry out plan: No  Past Psychiatric History/Hospitalization(s): Patient reported history of panic attacks 10 years ago and he was seen by primary care physician Dr Corky SoxVeeta Bland. who prescribed Valium which she took for few months.  [facility]  Patient was given Ambien, Xanax and P[facility]il [facility] [place].  Patient denies any history of psychiatric inpatient treatment, psychosis, suicidal attempt, paranoia, self  abusive behavior or any hallucination.  Denies any history of physical, sexual or any verbal abuse. Anxiety: Yes Bipolar Disorder: No Depression: No Mania: No Psychosis: No Schizophrenia: No Personality Disorder: No Hospitalization for psychiatric illness: No History of Electroconvulsive Shock Therapy: No Prior Suicide Attempts: No  Family History; Patient reported grand father from his mother's side has schizophrenia.  Medical History; Patient recently for his right meniscus off his right knee joint.  He is scheduled to have surgery.  Otherwise he is physically healthy.  He denies any history of seizures, headaches.  His primary care physician is Dr. Corky SoxVeeta Bland.   Review of Systems: Psychiatric: Agitation: No Hallucination: No Depressed Mood: No Insomnia: Yes Hypersomnia: No Altered Concentration: No Feels Worthless: No Grandiose Ideas: No Belief In Special Powers: No New/Increased Substance Abuse: No Compulsions: No  Neurologic: Headache: No Seizure: No Paresthesias: No   Outpatient Encounter Prescriptions as of 06/15/2016  Medication Sig  . aspirin-acetaminophen-caffeine (EXCEDRIN MIGRAINE) 250-250-65 MG per tablet Take 2 tablets by mouth every 6 (six) hours as needed for headache. Reported on 02/28/2016  . clonazePAM (KLONOPIN) 0.5 MG tablet Take 1/2 to 1 tab at bed time  . ibuprofen (ADVIL,MOTRIN) 200 MG tablet Take 800 mg by mouth every 6 (six) hours as needed. Reported on 02/28/2016  . [name] tablet Take 1 tablet (30 mg total) by mouth daily.   No facility-administered encounter medications on file as of 06/15/2016.     No results found for  this or any previous visit (from the past 2160 hour(s)).    Constitutional:  BP 138/90   Pulse 95   Wt 253 lb (114.8 kg)   BMI 35.29 kg/m    Musculoskeletal: Strength & Muscle Tone: within normal limits Gait & Station: normal Patient leans: N/A  Psychiatric Specialty Exam: General Appearance:  Casual  Eye Contact::  Fair  Speech:  Slow  Volume:  Normal  Mood:  Anxious  Affect:  Congruent  Thought Process:  Linear  Orientation:  Full (Time, Place, and Person)  Thought Content:  WDL  Suicidal Thoughts:  No  Homicidal Thoughts:  No  Memory:  Immediate;   Good Recent;   Good Remote;   Good  Judgement:  Good  Insight:  Good  Psychomotor Activity:  Normal  Concentration:  Good  Recall:  Good  Fund of Knowledge:  Good  Language:  Good  Akathisia:  No  Handed:  Right  AIMS (if indicated):     Assets:  Communication Skills Desire for Improvement Housing Physical Health Social Support  ADL's:  Intact  Cognition:  WNL  Sleep:        Established Problem, Stable/Improving (1), Review of Psycho-Social Stressors (1), Review and summation of old records (2), Established Problem, Worsening (2), Review of Medication Regimen & Side Effects (2) and Review of New Medication or Change in Dosage (2)  Assessment: 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  Panic attacks.  Axis II: Deferred  Axis III:  Past Medical History:  Diagnosis Date  . Anxiety      Plan:  Patient is taking [name] half to one tablet at bedtime and for anxiety.  He is also taking Paxil 30 mg.  Recommended to increase Paxil 40 mg to help his residual symptoms of depression and anxiety.  Patient has gained weight and I encouraged him to watch his calorie intake and due to her exercise.  He admitted due to recent knee surgery he was unable to walk but like to resume his regular exercise soon.  Discussed medication side effects and benefits.  Encouraged to keep appointment with [name] for coping and social skills.  We will get records from his primary care physician including blurred results which was done in February. I recommended to call us back if he has any question, concern or if testing of the symptom.  Discuss safety plan that anytime having active suicidal thoughts or homicidal thoughts and he  need to call 911 or go to the local emergency room.  Follow-up in 3 months.  [name], MD 06/15/2016

## 2016-08-17 DIAGNOSIS — B356 Tinea cruris: Secondary | ICD-10-CM | POA: Diagnosis not present

## 2016-09-04 DIAGNOSIS — N62 Hypertrophy of breast: Secondary | ICD-10-CM | POA: Diagnosis not present

## 2016-09-21 ENCOUNTER — Ambulatory Visit (HOSPITAL_COMMUNITY): Payer: Self-pay | Admitting: Psychiatry

## 2016-09-22 DIAGNOSIS — N62 Hypertrophy of breast: Secondary | ICD-10-CM | POA: Diagnosis not present

## 2016-09-25 ENCOUNTER — Ambulatory Visit (HOSPITAL_COMMUNITY): Payer: Self-pay | Admitting: Psychiatry

## 2016-09-28 ENCOUNTER — Ambulatory Visit (INDEPENDENT_AMBULATORY_CARE_PROVIDER_SITE_OTHER): Payer: 59 | Admitting: Psychiatry

## 2016-09-28 ENCOUNTER — Encounter (HOSPITAL_COMMUNITY): Payer: Self-pay | Admitting: Psychiatry

## 2016-09-28 DIAGNOSIS — Z888 Allergy status to other drugs, medicaments and biological substances status: Secondary | ICD-10-CM

## 2016-09-28 DIAGNOSIS — Z9889 Other specified postprocedural states: Secondary | ICD-10-CM | POA: Diagnosis not present

## 2016-09-28 DIAGNOSIS — F410 Panic disorder [episodic paroxysmal anxiety] without agoraphobia: Secondary | ICD-10-CM | POA: Diagnosis not present

## 2016-09-28 DIAGNOSIS — F411 Generalized anxiety disorder: Secondary | ICD-10-CM | POA: Diagnosis not present

## 2016-09-28 DIAGNOSIS — Z79899 Other long term (current) drug therapy: Secondary | ICD-10-CM

## 2016-09-28 MED ORDER — CLONAZEPAM 0.5 MG PO TABS: 0.5000 mg | ORAL_TABLET | Freq: Every day | ORAL | 1 refills | Status: DC

## 2016-09-28 MED ORDER — PAROXETINE HCL 30 MG PO TABS: 60.0000 mg | ORAL_TABLET | Freq: Every day | ORAL | 1 refills | Status: DC

## 2016-09-28 NOTE — Progress Notes (Signed)
BH MD/PA/NP OP Progress Note  09/28/2016 3:08 PM Brian [name]  MRN:  [id number]  Chief Complaint:  Subjective:  I'm feeling more anxious and I started to have panic attacks.  HPI: Patient came for her follow-up appointment.  He's been experiencing more anxiety and nervousness in recent weeks.  He also endorse having panic attacks.  He remember he took up call last week and found that 26 year old jump from the bridge and died.  Says that he's been very anxious and he admitted poor sleep and having racing thoughts.  He denies any nightmares or flashback but but feels overwhelmed.  He admitted job is stressful.  Patient works at [employer]/r[employer] in [place].  He also endorses not able to see Brian Lightning in a while but promised that he will resume counseling.  He lives with his girlfriend and 2 children.  Patient denies drinking alcohol or using any illegal substances.  His appetite is okay.  He has gained weight from the past.  He has no tremors or shakes.  He is taking Paxil 40 mg and [name] half tablet as needed.    Visit Diagnosis:    ICD-9-CM ICD-10-CM   1. 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 Patient reported history of panic attacks 10 years ago and he was seen by primary care physician Dr Corky Sox. He prescribed Valium which she took for few months.  He also saw a therapist at [facility].  Patient was given Ambien, Xanax and Paxil [facility] [place].  Patient denies any history of psychiatric inpatient treatment, psychosis, suicidal attempt, paranoia, self abusive behavior or any hallucination.  Denies any history of physical, sexual or any verbal abuse.  Past Medical History:  Past Medical History:  Diagnosis Date  . Anxiety     Past Surgical History:  Procedure Laterality Date  . ANTERIOR CRUCIATE LIGAMENT  REPAIR    . BACK SURGERY      Family Psychiatric History: Reviewed.  Family History:  Family History  Problem Relation Age of Onset  . DiGeorge syndrome Sister     Social History:  Social History   Social History  . Marital status: Single    Spouse name: N/A  . Number of children: N/A  . Years of education: N/A   Social History Main Topics  . Smoking status: Never Smoker  . Smokeless tobacco: Current User    Types: Snuff  . Alcohol use 0.0 oz/week  . Drug use: No  . Sexual activity: Yes    Birth control/ protection: Condom, Pill   Other Topics Concern  . Not on file   Social History Narrative  . No narrative on file    Allergies:  Allergies  Allergen Reactions  . Reglan [Metoclopramide] Other (See Comments)    Sweating and hot sensation in jaws an genital area     Metabolic Disorder Labs: No results found for: HGBA1C, MPG No results found for: PROLACTIN No results found for: CHOL, TRIG, HDL, CHOLHDL, VLDL, LDLCALC   Current Medications: Current Outpatient Prescriptions  Medication Sig Dispense Refill  . clonazePAM (KLONOPIN) 0.5 MG tablet Take 1/2 to 1 tab at bed time 30 tablet 1  . ibuprofen (ADVIL,MOTRIN) 200 MG tablet Take 800 mg by mouth every 6 (six) hours as needed. Reported on 02/28/2016    . PARoxetine (PAXIL)  40 MG tablet Take 1 tablet (40 mg total) by mouth daily. 30 tablet 2   No current facility-administered medications for this visit.     Neurologic: Headache: No Seizure: No Paresthesias: No  Musculoskeletal: Strength & Muscle Tone: within normal limits Gait & Station: normal Patient leans: N/A  Psychiatric Specialty Exam: Review of Systems  Constitutional: Positive for malaise/fatigue.  HENT: Negative.   Musculoskeletal: Negative.   Skin: Negative.   Neurological: Negative.   Psychiatric/Behavioral: The patient is nervous/anxious and has insomnia.     There were no vitals taken for this visit.There is no height or weight on  file to calculate BMI.  General Appearance: Casual  Eye Contact:  Fair  Speech:  Clear and Coherent  Volume:  Normal  Mood:  Anxious  Affect:  Constricted  Thought Process:  Goal Directed  Orientation:  Full (Time, Place, and Person)  Thought Content: Logical and Rumination   Suicidal Thoughts:  No  Homicidal Thoughts:  No  Memory:  Immediate;   Good Recent;   Good Remote;   Good  Judgement:  Good  Insight:  Good  Psychomotor Activity:  Normal  Concentration:  Concentration: Fair and Attention Span: Fair  Recall:  Good  Fund of Knowledge: Good  Language: Good  Akathisia:  No  Handed:  Right  AIMS (if indicated):  0  Assets:  Communication Skills Desire for Improvement Housing Physical Health Resilience Talents/Skills Vocational/Educational  ADL's:  Intact  Cognition: WNL  Sleep:  fair   Assessment: Generalized anxiety disorder.  Panic disorder without agoraphobia.  Plan: Discusses psychosocial stressors and trigger factors that contribute into his anxiety and panic attacks.  Strongly recommended to resume counseling with Brian [name].  I will also increase Paxil 60 mg daily and recommended to take [name] every night to help sleep and nervousness.  Discussed medication side effects.  If increase Paxil does not help then we will consider switching his medication.  Encouraged to watch his calorie intake and limit her exercise.  Follow-up in 2 months.   Brian Telleria T., MD 09/28/2016, 3:08 PM

## 2016-10-25 ENCOUNTER — Ambulatory Visit (HOSPITAL_COMMUNITY): Payer: Self-pay | Admitting: Clinical

## 2016-11-10 ENCOUNTER — Ambulatory Visit (INDEPENDENT_AMBULATORY_CARE_PROVIDER_SITE_OTHER): Payer: Commercial Managed Care - HMO | Admitting: Physician Assistant

## 2016-11-10 VITALS — BP 136/90 | HR 89 | Temp 98.5°F | Resp 16 | Ht 71.5 in | Wt 249.0 lb

## 2016-11-10 DIAGNOSIS — Z113 Encounter for screening for infections with a predominantly sexual mode of transmission: Secondary | ICD-10-CM | POA: Diagnosis not present

## 2016-11-10 DIAGNOSIS — Q6470 Unspecified congenital malformation of bladder and urethra: Secondary | ICD-10-CM

## 2016-11-10 NOTE — Patient Instructions (Addendum)
Sign up of my chart listed. Sexually Transmitted Disease A sexually transmitted disease (STD) is a disease or infection often passed to another person during sex. However, STDs can be passed through nonsexual ways. An STD can be passed through:  Spit (saliva).  Semen.  Blood.  Mucus from the vagina.  Pee (urine). How can I lessen my chances of getting an STD?  Use:  Latex condoms.  Water-soluble lubricants with condoms. Do not use petroleum jelly or oils.  Dental dams. These are small pieces of latex that are used as a barrier during oral sex.  Avoid having more than one sex partner.  Do not have sex with someone who has other sex partners.  Do not have sex with anyone you do not know or who is at high risk for an STD.  Avoid risky sex that can break your skin.  Do not have sex if you have open sores on your mouth or skin.  Avoid drinking too much alcohol or taking illegal drugs. Alcohol and drugs can affect your good judgment.  Avoid oral and anal sex acts.  Get shots (vaccines) for HPV and hepatitis.  If you are at risk of being infected with HIV, it is advised that you take a certain medicine daily to prevent HIV infection. This is called pre-exposure prophylaxis (PrEP). You may be at risk if:  You are a man who has sex with other men (MSM).  You are attracted to the opposite sex (heterosexual) and are having sex with more than one partner.  You take drugs with a needle.  You have sex with someone who has HIV.  Talk with your doctor about if you are at high risk of being infected with HIV. If you begin to take PrEP, get tested for HIV first. Get tested every 3 months for as long as you are taking PrEP.  Get tested for STDs every year if you are sexually active. If you are treated for an STD, get tested again 3 months after you are treated. What should I do if I think I have an STD?  See your doctor.  Tell your sex partner(s) that you have an STD. They should  be tested and treated.  Do not have sex until your doctor says it is okay. When should I get help? Get help right away if:  You have bad belly (abdominal) pain.  You are a man and have puffiness (swelling) or pain in your testicles.  You are a woman and have puffiness in your vagina. This information is not intended to replace advice given to you by your health care provider. Make sure you discuss any questions you have with your health care provider. Document Released: 09/07/2004 Document Revised: 01/06/2016 Document Reviewed: 01/24/2013 Elsevier Interactive Patient Education  2017 [employer].      IF you received an x-ray today, you will receive an invoice from [facility]. Please contact [facility] at [phone] with questions or concerns regarding your invoice.   IF you received labwork today, you will receive an invoice from [facility]. Please contact [facility] at [phone] with questions or concerns regarding your invoice.   Our billing staff will not be able to assist you with questions regarding bills from these companies.  You will be contacted with the lab results as soon as they are available. The fastest way to get your results is to activate your My Chart account. Instructions are located on the last page of this paperwork. If you have  not heard from us regarding the results in 2 weeks, please contact this office.      

## 2016-11-11 NOTE — Progress Notes (Signed)
PRIMARY CARE AT [facility] [address], [place] [address] [phone]  Date:  11/10/2016   Name:  Brian [name]   DOB:  1991-05-13   MRN:  [id number]  PCP:  Geraldo Pitter, MD    History of Present Illness:  Brian [name] is a 26 y.o. male patient who presents to PCP with  Chief Complaint  Patient presents with  . STD Screening    Noticed bump on penis, no discharge or urinary symptoms     --patient is here for std screening.  He was sexually active with multiple partners several weeks ago.  This was protected.  He has reunited with girlfriend, and they are sexually active.  She complains of uti symptoms which concerned him that he may have transmitted infection  He has noticed that there is an "ulcer" lesion on his penis.  No dysuria, hematuria, frequency, other rashes, abdominal pian, or penile discharge.   Patient Active Problem List   Diagnosis Date Noted  . Panic attacks 10/03/2015  . Palpitations 10/03/2015  . Gynecomastia, male 10/03/2015  . Lumbar disc disease 11/01/2011    Past Medical History:  Diagnosis Date  . Anxiety     Past Surgical History:  Procedure Laterality Date  . ANTERIOR CRUCIATE LIGAMENT REPAIR    . BACK SURGERY      Social History  Substance Use Topics  . Smoking status: Never Smoker  . Smokeless tobacco: Current User    Types: Snuff  . Alcohol use 0.0 oz/week    Family History  Problem Relation Age of Onset  . DiGeorge syndrome Sister     Allergies  Allergen Reactions  . Reglan [Metoclopramide] Other (See Comments)    Sweating and hot sensation in jaws an genital area     Medication list has been reviewed and updated.  Current Outpatient Prescriptions on File Prior to Visit  Medication Sig Dispense Refill  . clonazePAM (KLONOPIN) 0.5 MG tablet Take 1 tablet (0.5 mg total) by mouth at bedtime. 30 tablet 1  . [name] tablet Take 2 tablets (60 mg total) by mouth daily. 60 tablet 1  . ibuprofen (ADVIL,MOTRIN)  200 MG tablet Take 800 mg by mouth every 6 (six) hours as needed. Reported on 02/28/2016     No current facility-administered medications on file prior to visit.     ROS ROS otherwise unremarkable unless listed above.  Physical Examination: BP 136/90   Pulse 89   Temp 98.5 F (36.9 C) (Oral)   Resp 16   Ht 5' 11.5" (1.816 m)   Wt 249 lb (112.9 kg)   SpO2 97%   BMI 34.24 kg/m  Ideal Body Weight: Weight in (lb) to have BMI = 25: 181.4  Physical Exam  Constitutional: He is oriented to person, place, and time. He appears well-developed and well-nourished. No distress.  HENT:  Head: Normocephalic and atraumatic.  Eyes: Conjunctivae and EOM are normal. Pupils are equal, round, and reactive to light.  Cardiovascular: Normal rate.   Pulmonary/Chest: Effort normal. No respiratory distress.  Genitourinary:  Genitourinary Comments: Hypopigmented superior area of the meatus however not ulcerated  Neurological: He is alert and oriented to person, place, and time.  Skin: Skin is warm and dry. He is not diaphoretic.  Psychiatric: He has a normal mood and affect. His behavior is normal.     Assessment and Plan: Brian [name] is a 26 y.o. male who is here today for std. Appears consistent with normal anatomy.  Will obtain labs as listed below Screening examination for STD (sexually transmitted disease) - Plan: HIV antibody, RPR, GC/Chlamydia Probe Amp  Abnormality of urethral meatus - Plan: RPR, GC/Chlamydia Probe Amp  Trena Platt, PA-C [facility] 3/31/201811:06 PM

## 2016-11-12 LAB — RPR: RPR Ser Ql: NONREACTIVE

## 2016-11-12 LAB — HIV ANTIBODY (ROUTINE TESTING W REFLEX): HIV SCREEN 4TH GENERATION: NONREACTIVE

## 2016-11-13 LAB — GC/CHLAMYDIA PROBE AMP
Chlamydia trachomatis, NAA: NEGATIVE
NEISSERIA GONORRHOEAE BY PCR: NEGATIVE

## 2016-11-27 DIAGNOSIS — Z0131 Encounter for examination of blood pressure with abnormal findings: Secondary | ICD-10-CM | POA: Diagnosis not present

## 2016-11-30 ENCOUNTER — Ambulatory Visit (HOSPITAL_COMMUNITY): Payer: Self-pay | Admitting: Psychiatry

## 2016-12-20 ENCOUNTER — Other Ambulatory Visit (HOSPITAL_COMMUNITY): Payer: Self-pay

## 2016-12-20 DIAGNOSIS — F410 Panic disorder [episodic paroxysmal anxiety] without agoraphobia: Secondary | ICD-10-CM

## 2016-12-20 MED ORDER — CLONAZEPAM 0.5 MG PO TABS: 0.5000 mg | ORAL_TABLET | Freq: Every day | ORAL | 0 refills | Status: DC

## 2016-12-25 ENCOUNTER — Encounter (HOSPITAL_COMMUNITY): Payer: Self-pay | Admitting: Psychiatry

## 2016-12-25 ENCOUNTER — Ambulatory Visit (INDEPENDENT_AMBULATORY_CARE_PROVIDER_SITE_OTHER): Payer: 59 | Admitting: Psychiatry

## 2016-12-25 DIAGNOSIS — F411 Generalized anxiety disorder: Secondary | ICD-10-CM | POA: Diagnosis not present

## 2016-12-25 DIAGNOSIS — F410 Panic disorder [episodic paroxysmal anxiety] without agoraphobia: Secondary | ICD-10-CM

## 2016-12-25 DIAGNOSIS — Z79899 Other long term (current) drug therapy: Secondary | ICD-10-CM | POA: Diagnosis not present

## 2016-12-25 MED ORDER — CLONAZEPAM 0.5 MG PO TABS: 0.5000 mg | ORAL_TABLET | Freq: Every day | ORAL | 2 refills | Status: DC

## 2016-12-25 MED ORDER — PAROXETINE HCL 30 MG PO TABS: 60.0000 mg | ORAL_TABLET | Freq: Every day | ORAL | 1 refills | Status: DC

## 2016-12-25 NOTE — Progress Notes (Signed)
BH MD/PA/NP OP Progress Note  12/25/2016 12:10 PM Brian [name]  MRN:  [id number]  Chief Complaint:  Subjective:  I'm feeling better with increase Paxil but I still get some time poor sleep.  HPI: Patient came for his.  Appointment.  On his last visit we increased Paxil and he is not taking 60 mg.  He also takes [name] at bedtime.  Overall he reported his panic attacks and anxiety is much better but he continues to have some nights poor sleep when he is involved in any intense trauma.  Patient works at [employer].  He used to see Tomma Lightning but has not seen in a while.  Continue to go back to see therapy.  He admitted his job is stressful but so far he is handling much better.  He denies any irritability, anger, mania, psychosis or any hallucination.  He lives with his girlfriend and 2 children.  He mentioned his relationship is going very well.  Patient denies drinking alcohol or using any illegal substances.  His appetite is okay.  His energy level is good.  He has no tremors, shakes or any EPS.  Recently he start taking Spirolactone which is prescribed by his primary care physician for high blood pressure.  Visit Diagnosis:    ICD-9-CM ICD-10-CM   1. 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 Patient reported history of panic attacks 10 years ago and he was seen by primary care physician Dr Corky Sox. He prescribed Valium which she took for few months. He also saw a therapist at [facility] mental [facility]. Patient was given Ambien, Xanax and Paxil [facility] [place]. Patient denies any history of psychiatric inpatient treatment, psychosis, suicidal attempt, paranoia, self abusive behavior or any hallucination. Denies any history of physical, sexual or any verbal abuse.  Past Medical History:  Past Medical History:   Diagnosis Date  . Anxiety     Past Surgical History:  Procedure Laterality Date  . ANTERIOR CRUCIATE LIGAMENT REPAIR    . BACK SURGERY      Family Psychiatric History: Reviewed.  Family History:  Family History  Problem Relation Age of Onset  . DiGeorge syndrome Sister     Social History:  Social History   Social History  . Marital status: Single    Spouse name: N/A  . Number of children: N/A  . Years of education: N/A   Social History Main Topics  . Smoking status: Never Smoker  . Smokeless tobacco: Current User    Types: Snuff  . Alcohol use 0.0 oz/week  . Drug use: No  . Sexual activity: Yes    Birth control/ protection: Condom, Pill   Other Topics Concern  . Not on file   Social History Narrative  . No narrative on file    Allergies:  Allergies  Allergen Reactions  . Reglan [Metoclopramide] Other (See Comments)    Sweating and hot sensation in jaws an genital area     Metabolic Disorder Labs: No results found for: HGBA1C, MPG No results found for: PROLACTIN No results found for: CHOL, TRIG, HDL, CHOLHDL, VLDL, LDLCALC   Current Medications: Current Outpatient Prescriptions  Medication Sig Dispense Refill  . clonazePAM (KLONOPIN) 0.5 MG tablet Take 1 tablet (0.5 mg total) by mouth at bedtime. 7 tablet 0  . ibuprofen (ADVIL,MOTRIN) 200 MG  tablet Take 800 mg by mouth every 6 (six) hours as needed. Reported on 02/28/2016    . [name] tablet Take 2 tablets (60 mg total) by mouth daily. 60 tablet 1   No current facility-administered medications for this visit.     Neurologic: Headache: No Seizure: No Paresthesias: No  Musculoskeletal: Strength & Muscle Tone: within normal limits Gait & Station: normal Patient leans: N/A  Psychiatric Specialty Exam: ROS  Blood pressure 104/68, pulse 63, height 5\' 11"  (1.803 m), weight 238 lb (108 kg), SpO2 97 %.There is no height or weight on file to calculate BMI.  General Appearance: Casual   Eye Contact:  Good  Speech:  Clear and Coherent  Volume:  Normal  Mood:  Euthymic  Affect:  Congruent  Thought Process:  Goal Directed  Orientation:  Full (Time, Place, and Person)  Thought Content: WDL and Logical   Suicidal Thoughts:  No  Homicidal Thoughts:  No  Memory:  Immediate;   Good Recent;   Good Remote;   Good  Judgement:  Good  Insight:  Good  Psychomotor Activity:  Normal  Concentration:  Concentration: Good and Attention Span: Good  Recall:  Good  Fund of Knowledge: Good  Language: Good  Akathisia:  No  Handed:  Right  AIMS (if indicated):  0  Assets:  Communication Skills Desire for Improvement Housing Physical Health Resilience Social Support Talents/Skills Transportation Vocational/Educational  ADL's:  Intact  Cognition: WNL  Sleep:  fair   Assessment: Generalized anxiety disorder.  Panic disorder without agoraphobia.  Plan: Discusses psychosocial stressors and triggers factors that contributing his anxiety and panic attacks.  Strongly encouraged to see therapist.  Patient used to see [name].  He has seen improvement with increased Paxil.  Continue Paxil 60 mg daily and [name] at bedtime.  Discuss that occasion side effects especially benzodiazepine dependence, tolerance and withdrawal.  Recommended to call us back if he has any question, concern or if he feel worsening of the symptom.  Follow-up in 3 months.  Necie Wilcoxson T., MD 12/25/2016, 12:10 PM

## 2017-01-18 ENCOUNTER — Ambulatory Visit (HOSPITAL_COMMUNITY): Payer: Self-pay | Admitting: Clinical

## 2017-01-26 DIAGNOSIS — I10 Essential (primary) hypertension: Secondary | ICD-10-CM | POA: Diagnosis not present

## 2017-03-22 ENCOUNTER — Other Ambulatory Visit (HOSPITAL_COMMUNITY): Payer: Self-pay | Admitting: Psychiatry

## 2017-03-22 ENCOUNTER — Other Ambulatory Visit (HOSPITAL_COMMUNITY): Payer: Self-pay

## 2017-03-22 DIAGNOSIS — F410 Panic disorder [episodic paroxysmal anxiety] without agoraphobia: Secondary | ICD-10-CM

## 2017-03-22 MED ORDER — PAROXETINE HCL 30 MG PO TABS: 60.0000 mg | ORAL_TABLET | Freq: Every day | ORAL | 0 refills | Status: DC

## 2017-03-22 MED ORDER — CLONAZEPAM 0.5 MG PO TABS: 0.5000 mg | ORAL_TABLET | Freq: Every day | ORAL | 0 refills | Status: DC

## 2017-03-27 ENCOUNTER — Ambulatory Visit (HOSPITAL_COMMUNITY): Payer: 59 | Admitting: Psychiatry

## 2017-04-23 ENCOUNTER — Ambulatory Visit (INDEPENDENT_AMBULATORY_CARE_PROVIDER_SITE_OTHER): Payer: 59 | Admitting: Psychiatry

## 2017-04-23 ENCOUNTER — Encounter (HOSPITAL_COMMUNITY): Payer: Self-pay | Admitting: Psychiatry

## 2017-04-23 DIAGNOSIS — F411 Generalized anxiety disorder: Secondary | ICD-10-CM | POA: Diagnosis not present

## 2017-04-23 DIAGNOSIS — M2550 Pain in unspecified joint: Secondary | ICD-10-CM | POA: Diagnosis not present

## 2017-04-23 DIAGNOSIS — F515 Nightmare disorder: Secondary | ICD-10-CM

## 2017-04-23 DIAGNOSIS — F410 Panic disorder [episodic paroxysmal anxiety] without agoraphobia: Secondary | ICD-10-CM

## 2017-04-23 MED ORDER — PAROXETINE HCL 30 MG PO TABS: 60.0000 mg | ORAL_TABLET | Freq: Every day | ORAL | 2 refills | Status: DC

## 2017-04-23 MED ORDER — CLONAZEPAM 0.5 MG PO TABS: 0.5000 mg | ORAL_TABLET | Freq: Every day | ORAL | 2 refills | Status: DC

## 2017-04-23 NOTE — Progress Notes (Signed)
BH MD/PA/NP OP Progress Note  04/23/2017 8:33 AM Brian [name]  MRN:  [id number]  Chief Complaint:  I'm doing very good with the medication.  I have 1 major panic attack but medicine helping.  HPI: Patient came for his follow-up appointment.  He's taking Paxil 60 mg and [name] at bedtime.  Overall his anxiety and panic attacks are much better.  He has a 1 major panic attack a few weeks ago when he was coming from work.  He started to have nightmares, bad images but he took Klonopin and that helped him.  He feels the medicine working very well.  Patient is working in a [employer].  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  But he feels he is doing much better at work.  He is happy that his girlfriend is having an interview to work with shelter.  Patient denies any irritability, anger, mania, psychosis.  He lives with his girlfriend and 2 children.  Relationship is going very well.  He denies drinking alcohol or using any illegal substances.  He has right knee pain and since then he has not able to run or jog.  He admitted weight gain and hoping to watch his calorie intake.  His blood pressure is normal.  His vital signs are stable.  His energy level is good.  He denies any feeling of hopelessness or worthlessness.  Visit Diagnosis:    ICD-10-CM   1. Panic disorder without agoraphobia F41.0 [name] tablet    clonazePAM (KLONOPIN) 0.5 MG tablet    Past Psychiatric History: Reviewed. Patient reported history of panic attacks 10 years ago and he was seen by primary care physician Dr Corky Sox. Heprescribed Valium which she took for few months. He also saw a therapist at [facility]. Patient was given Ambien, Xanax and Paxil [facility] [place]. Patient denies any history of psychiatric inpatient treatment, psychosis, suicidal attempt, paranoia, self abusive behavior or any  hallucination. Denies any history of physical, sexual or any verbal abuse.  Past Medical History:  Past Medical History:  Diagnosis Date  . Anxiety     Past Surgical History:  Procedure Laterality Date  . ANTERIOR CRUCIATE LIGAMENT REPAIR    . BACK SURGERY      Family Psychiatric History: Reviewed.  Family History:  Family History  Problem Relation Age of Onset  . DiGeorge syndrome Sister     Social History:  Social History   Social History  . Marital status: Single    Spouse name: N/A  . Number of children: N/A  . Years of education: N/A   Social History Main Topics  . Smoking status: Never Smoker  . Smokeless tobacco: Current User    Types: Snuff  . Alcohol use 0.0 oz/week  . Drug use: No  . Sexual activity: Yes    Birth control/ protection: Condom, Pill   Other Topics Concern  . None   Social History Narrative  . None    Allergies:  Allergies  Allergen Reactions  . Reglan [Metoclopramide] Other (See Comments)    Sweating and hot sensation in jaws an genital area     Metabolic Disorder Labs: No results found for: HGBA1C, MPG No results found for: PROLACTIN No results found for: CHOL, TRIG, HDL, CHOLHDL, VLDL, LDLCALC No results found for: TSH  Therapeutic Level Labs: No results found for: LITHIUM No results found for:  VALPROATE No components found for:  CBMZ  Current Medications: Current Outpatient Prescriptions  Medication Sig Dispense Refill  . clonazePAM (KLONOPIN) 0.5 MG tablet Take 1 tablet (0.5 mg total) by mouth at bedtime. 30 tablet 0  . ibuprofen (ADVIL,MOTRIN) 200 MG tablet Take 800 mg by mouth every 6 (six) hours as needed. Reported on 02/28/2016    . [name] tablet Take 2 tablets (60 mg total) by mouth daily. 60 tablet 0   No current facility-administered medications for this visit.      Musculoskeletal: Strength & Muscle Tone: within normal limits Gait & Station: normal Patient leans: N/A  Psychiatric  Specialty Exam: Review of Systems  Constitutional: Negative for weight loss.  Musculoskeletal: Positive for joint pain.    Blood pressure 122/76, pulse 79, height 6' (1.829 m), weight 253 lb 9.6 oz (115 kg).Body mass index is 34.39 kg/m.  General Appearance: Casual  Eye Contact:  Good  Speech:  Clear and Coherent  Volume:  Normal  Mood:  Euthymic  Affect:  Congruent  Thought Process:  Goal Directed  Orientation:  Full (Time, Place, and Person)  Thought Content: Logical   Suicidal Thoughts:  No  Homicidal Thoughts:  No  Memory:  Immediate;   Good Recent;   Good Remote;   Good  Judgement:  Good  Insight:  Good  Psychomotor Activity:  Normal  Concentration:  Concentration: Good and Attention Span: Good  Recall:  Good  Fund of Knowledge: Good  Language: Good  Akathisia:  No  Handed:  Right  AIMS (if indicated): not done  Assets:  Communication Skills Desire for Improvement Housing Resilience Social Support Talents/Skills Transportation  ADL's:  Intact  Cognition: WNL  Sleep:  Good   Screenings: PHQ2-9     [facility]  PHQ-2 Total Score  0  0       Assessment and Plan: Generalized anxiety disorder.  Panic disorder without agoraphobia.  Patient doing much better with the medication.  He is taking Paxil 60 mg daily and [name] at bedtime.  He has no tremors shakes or any EPS.  Patient may require any surgery and coming weeks.  I encouraged to watch his calorie intake as he had gained weight from the past.  Discussed medication side effects and benefits specialty benzodiazepine dependence tolerance and withdrawal.  Recommended to call us back if he is any question or any concern.  Follow-up in 3 months.  and   Martine Trageser T., MD 04/23/2017, 8:33 AM

## 2017-05-25 DIAGNOSIS — J069 Acute upper respiratory infection, unspecified: Secondary | ICD-10-CM | POA: Diagnosis not present

## 2017-05-27 ENCOUNTER — Encounter (HOSPITAL_COMMUNITY): Payer: Self-pay | Admitting: Emergency Medicine

## 2017-05-27 ENCOUNTER — Emergency Department (HOSPITAL_COMMUNITY)
Admission: EM | Admit: 2017-05-27 | Discharge: 2017-05-28 | Disposition: A | Discharge status: Home / Self Care | Payer: Self-pay | Attending: Emergency Medicine | Admitting: Emergency Medicine

## 2017-05-27 DIAGNOSIS — Z79899 Other long term (current) drug therapy: Secondary | ICD-10-CM | POA: Insufficient documentation

## 2017-05-27 DIAGNOSIS — H9209 Otalgia, unspecified ear: Secondary | ICD-10-CM | POA: Insufficient documentation

## 2017-05-27 DIAGNOSIS — J029 Acute pharyngitis, unspecified: Secondary | ICD-10-CM | POA: Insufficient documentation

## 2017-05-27 DIAGNOSIS — B349 Viral infection, unspecified: Secondary | ICD-10-CM | POA: Insufficient documentation

## 2017-05-27 DIAGNOSIS — J028 Acute pharyngitis due to other specified organisms: Secondary | ICD-10-CM | POA: Diagnosis not present

## 2017-05-27 LAB — RAPID STREP SCREEN (MED CTR MEBANE ONLY): Streptococcus, Group A Screen (Direct): NEGATIVE

## 2017-05-27 MED ORDER — OXYCODONE-ACETAMINOPHEN 5-325 MG PO TABS
1.0000 | ORAL_TABLET | ORAL | Status: DC | PRN
  Administered 2017-05-27: 1 via ORAL

## 2017-05-27 MED ORDER — OXYCODONE-ACETAMINOPHEN 5-325 MG PO TABS
ORAL_TABLET | ORAL | Status: AC
  Filled 2017-05-27: qty 1

## 2017-05-27 NOTE — ED Triage Notes (Signed)
Pt reports his throat has been swollen for several days, when to the urgent care and was diagnosed w/ URI.  Continues to get worse.  Is now having trouble swallowing.  No fever.

## 2017-05-28 ENCOUNTER — Telehealth: Payer: Self-pay | Admitting: Surgery

## 2017-05-28 MED ORDER — DEXAMETHASONE SODIUM PHOSPHATE 10 MG/ML IJ SOLN
10.0000 mg | Freq: Once | INTRAMUSCULAR | Status: AC
  Administered 2017-05-28: 10 mg via INTRAMUSCULAR
  Filled 2017-05-28: qty 1

## 2017-05-28 MED ORDER — MAGIC MOUTHWASH W/LIDOCAINE: 5.0000 mL | Freq: Three times a day (TID) | ORAL | 0 refills | Status: AC

## 2017-05-28 MED ORDER — GI COCKTAIL ~~LOC~~
30.0000 mL | Freq: Once | ORAL | Status: AC
  Administered 2017-05-28: 30 mL via ORAL
  Filled 2017-05-28: qty 30

## 2017-05-28 MED ORDER — FLUTICASONE PROPIONATE 50 MCG/ACT NA SUSP: 2.0000 | Freq: Every day | NASAL | 2 refills | Status: DC

## 2017-05-28 NOTE — ED Provider Notes (Signed)
[facility]-EMERGENCY DEPT Provider Note   CSN: [id number] Arrival date & time: 05/27/17  2114     History   Chief Complaint Chief Complaint  Patient presents with  . Sore Throat  . Otalgia    HPI Brian [name] is a 26 y.o. male with a hx of anxiety presents to the Emergency Department complaining of gradual, persistent, progressively worsening ST onset 5 days ago. Associated symptoms include  Post nasal drip, adenopathy.  Pt reports he is able to swallow but it is very painful.  Pt reports he has taken tylenol and ibuprofen without relief.  He reports decreased PO intake due to painful swallowing.  Eating and drinking makes the pain worse.  No sick contacts.  Denies fever, chills, headache, neck pain, chest pain, SOB, abd pain, N/V/D, weakness, dizziness.    The history is provided by the patient and medical records. No language interpreter was used.    Past Medical History:  Diagnosis Date  . Anxiety     Patient Active Problem List   Diagnosis Date Noted  . Panic attacks 10/03/2015  . Palpitations 10/03/2015  . Gynecomastia, male 10/03/2015  . Lumbar disc disease 11/01/2011    Past Surgical History:  Procedure Laterality Date  . ANTERIOR CRUCIATE LIGAMENT REPAIR    . BACK SURGERY         Home Medications    Prior to Admission medications   Medication Sig Start Date End Date Taking? Authorizing Provider  clonazePAM (KLONOPIN) 0.5 MG tablet Take 1 tablet (0.5 mg total) by mouth at bedtime. 04/23/17   Arfeen, Phillips Grout, MD  fluticasone (FLONASE) 50 MCG/ACT nasal spray Place 2 sprays into both nostrils daily. 05/28/17   Elfrieda Espino, Dahlia Client, PA-C  ibuprofen (ADVIL,MOTRIN) 200 MG tablet Take 800 mg by mouth every 6 (six) hours as needed. Reported on 02/28/2016    [provider]  magic mouthwash w/lidocaine SOLN Take 5 mLs by mouth 3 (three) times daily. 05/28/17 06/02/17  Grayce Budden, Dahlia Client, PA-C  [name] tablet Take 2 tablets (60 mg total) by mouth  daily. 04/23/17   Arfeen, Phillips Grout, MD    Family History Family History  Problem Relation Age of Onset  . DiGeorge syndrome Sister     Social History Social History  Substance Use Topics  . Smoking status: Never Smoker  . Smokeless tobacco: Current User    Types: Snuff  . Alcohol use 0.0 oz/week     Allergies   Reglan [metoclopramide]   Review of Systems Review of Systems  Constitutional: Negative for chills, fatigue and fever.  HENT: Positive for postnasal drip and sore throat. Negative for congestion, dental problem, drooling, ear pain, facial swelling, mouth sores, rhinorrhea, trouble swallowing and voice change.   Eyes: Negative for pain.  Respiratory: Negative for cough, chest tightness and shortness of breath.   Cardiovascular: Negative for chest pain.  Gastrointestinal: Negative for abdominal pain, nausea and vomiting.  Musculoskeletal: Negative for neck pain and neck stiffness.  Skin: Negative for rash.  Neurological: Negative for facial asymmetry and headaches.  Hematological: Positive for adenopathy.  Psychiatric/Behavioral: The patient is not nervous/anxious.      Physical Exam Updated Vital Signs BP (!) 145/93 (BP Location: Right Arm)   Pulse 90   Temp 99.4 F (37.4 C) (Oral)   Resp 18   Ht 6' (1.829 m)   Wt 111.1 kg (245 lb)   SpO2 99%   BMI 33.23 kg/m   Physical Exam  Constitutional: He appears well-developed  and well-nourished. No distress.  HENT:  Head: Normocephalic and atraumatic.  Right Ear: Tympanic membrane, external ear and ear canal normal. Tympanic membrane is not injected, not erythematous and not bulging. No middle ear effusion.  Left Ear: External ear and ear canal normal. Tympanic membrane is erythematous. Tympanic membrane is not injected and not bulging.  No middle ear effusion.  Nose: Nose normal. No mucosal edema or rhinorrhea.  Mouth/Throat: Uvula is midline and mucous membranes are normal. Mucous membranes are not dry. No  trismus in the jaw. No uvula swelling. Posterior oropharyngeal edema and posterior oropharyngeal erythema present. No oropharyngeal exudate or tonsillar abscesses.  Posterior oropharynx with erythema and edema without exudate  Eyes: Conjunctivae are normal.  Neck: Normal range of motion, full passive range of motion without pain and phonation normal. No tracheal tenderness, no spinous process tenderness and no muscular tenderness present. No neck rigidity. No erythema and normal range of motion present. No Brudzinski's sign and no Kernig's sign noted.  Range of motion without pain  No midline or paraspinal tenderness Normal phonation No stridor Handling secretions without difficulty No nuchal rigidity or meningeal signs  Cardiovascular: Normal rate, regular rhythm and normal heart sounds.   Pulses:      Radial pulses are 2+ on the right side, and 2+ on the left side.  Pulmonary/Chest: Effort normal and breath sounds normal. No stridor. No respiratory distress. He has no decreased breath sounds. He has no wheezes.  Equal chest expansion, clear and equal breath sounds without focal wheezes, rhonchi or rales  Musculoskeletal: Normal range of motion.  Lymphadenopathy:       Head (right side): Submandibular and tonsillar adenopathy present. No submental, no preauricular, no posterior auricular and no occipital adenopathy present.       Head (left side): Submandibular and tonsillar adenopathy present. No submental, no preauricular, no posterior auricular and no occipital adenopathy present.    He has no cervical adenopathy.       Right cervical: No superficial cervical, no deep cervical and no posterior cervical adenopathy present.      Left cervical: No superficial cervical, no deep cervical and no posterior cervical adenopathy present.  Neurological: He is alert.  Alert and oriented Moves all extremities without ataxia  Skin: Skin is warm and dry. He is not diaphoretic.  Psychiatric: He has a  normal mood and affect.  Nursing note and vitals reviewed.    ED Treatments / Results  Labs (all labs ordered are listed, but only abnormal results are displayed) Labs Reviewed  RAPID STREP SCREEN (NOT AT [facility])  CULTURE, GROUP A STREP [facility])    Procedures Procedures (including critical care time)  Medications Ordered in ED Medications  oxyCODONE-acetaminophen (PERCOCET/ROXICET) 5-325 MG per tablet 1 tablet (1 tablet Oral Given 05/27/17 2308)  oxyCODONE-acetaminophen (PERCOCET/ROXICET) 5-325 MG per tablet (not administered)  gi cocktail (Maalox,Lidocaine,Donnatal) (30 mLs Oral Given 05/28/17 0112)  dexamethasone (DECADRON) injection 10 mg (10 mg Intramuscular Given 05/28/17 0112)     Initial Impression / Assessment and Plan / ED Course  I have reviewed the triage vital signs and the nursing notes.  Pertinent labs & imaging results that were available during my care of the patient were reviewed by me and considered in my medical decision making (see chart for details).     Pt afebrile without tonsillar exudate, negative strep. Presents with mild cervical lymphadenopathy, & dysphagia; diagnosis of viral pharyngitis. No abx indicated. Discharged with symptomatic tx for pain  Pt does not  appear dehydrated, but did discuss importance of water rehydration. Presentation non concerning for PTA or RPA. No trismus or uvula deviation. Specific return precautions discussed. Pt able to drink water in ED without difficulty with intact air way. Recommended PCP follow up.   Final Clinical Impressions(s) / ED Diagnoses   Final diagnoses:  Viral pharyngitis    New Prescriptions New Prescriptions   FLUTICASONE (FLONASE) 50 MCG/ACT NASAL SPRAY    Place 2 sprays into both nostrils daily.   MAGIC MOUTHWASH W/LIDOCAINE SOLN    Take 5 mLs by mouth 3 (three) times daily.     Mickaela Starlin, Dahlia Client, PA-C 05/28/17 0121    Gilda Crease, MD 05/28/17 [phone]

## 2017-05-28 NOTE — ED Notes (Signed)
Pt left at this time with all belongings.  

## 2017-05-28 NOTE — Telephone Encounter (Signed)
ED CM received call from [facility] concerning prescription clarification. ED CM redirected Pharmacist to inpatient pharmacy for clarification on the magic mouthwash.

## 2017-05-28 NOTE — Discharge Instructions (Signed)
1. Medications: magic mouthwash, flonase, usual home medications 2. Treatment: rest, drink plenty of fluids,  3. Follow Up: Please followup with your primary doctor in 2-3 days for discussion of your diagnoses and further evaluation after today's visit; if you do not have a primary care doctor use the resource guide provided to find one; Please return to the ER for worsening symptoms, difficulty breathing, inability to swallow, high fevers.

## 2017-05-29 ENCOUNTER — Other Ambulatory Visit: Payer: Self-pay | Admitting: Family Medicine

## 2017-05-29 ENCOUNTER — Ambulatory Visit
Admission: RE | Admit: 2017-05-29 | Discharge: 2017-05-29 | Disposition: A | Discharge status: Home / Self Care | Payer: 59 | Source: Ambulatory Visit | Attending: Family Medicine | Admitting: Family Medicine

## 2017-05-29 DIAGNOSIS — R221 Localized swelling, mass and lump, neck: Secondary | ICD-10-CM | POA: Diagnosis not present

## 2017-05-29 DIAGNOSIS — J36 Peritonsillar abscess: Secondary | ICD-10-CM | POA: Diagnosis not present

## 2017-05-29 DIAGNOSIS — J391 Other abscess of pharynx: Secondary | ICD-10-CM | POA: Diagnosis not present

## 2017-05-29 DIAGNOSIS — J390 Retropharyngeal and parapharyngeal abscess: Secondary | ICD-10-CM

## 2017-05-29 MED ORDER — IOPAMIDOL (ISOVUE-300) INJECTION 61%
75.0000 mL | Freq: Once | INTRAVENOUS | Status: AC | PRN
  Administered 2017-05-29: 75 mL via INTRAVENOUS

## 2017-05-30 LAB — CULTURE, GROUP A STREP (THRC)

## 2017-06-18 ENCOUNTER — Other Ambulatory Visit: Payer: Self-pay

## 2017-06-18 ENCOUNTER — Encounter (HOSPITAL_COMMUNITY): Payer: Self-pay | Admitting: *Deleted

## 2017-06-18 ENCOUNTER — Emergency Department (HOSPITAL_COMMUNITY)
Admission: EM | Admit: 2017-06-18 | Discharge: 2017-06-18 | Disposition: A | Discharge status: Home / Self Care | Payer: Worker's Compensation | Attending: Emergency Medicine | Admitting: Emergency Medicine

## 2017-06-18 DIAGNOSIS — R310 Gross hematuria: Secondary | ICD-10-CM

## 2017-06-18 DIAGNOSIS — Z7982 Long term (current) use of aspirin: Secondary | ICD-10-CM | POA: Diagnosis not present

## 2017-06-18 DIAGNOSIS — Z79899 Other long term (current) drug therapy: Secondary | ICD-10-CM | POA: Insufficient documentation

## 2017-06-18 DIAGNOSIS — R319 Hematuria, unspecified: Secondary | ICD-10-CM | POA: Diagnosis present

## 2017-06-18 DIAGNOSIS — I10 Essential (primary) hypertension: Secondary | ICD-10-CM | POA: Diagnosis not present

## 2017-06-18 DIAGNOSIS — F17228 Nicotine dependence, chewing tobacco, with other nicotine-induced disorders: Secondary | ICD-10-CM | POA: Diagnosis not present

## 2017-06-18 HISTORY — DX: Essential (primary) hypertension: I10

## 2017-06-18 LAB — URINALYSIS, ROUTINE W REFLEX MICROSCOPIC
BACTERIA UA: NONE SEEN
Bilirubin Urine: NEGATIVE
Glucose, UA: NEGATIVE mg/dL
Ketones, ur: NEGATIVE mg/dL
Leukocytes, UA: NEGATIVE
Nitrite: NEGATIVE
Protein, ur: 30 mg/dL — AB
SPECIFIC GRAVITY, URINE: 1.024 (ref 1.005–1.030)
Squamous Epithelial / LPF: NONE SEEN
pH: 6 (ref 5.0–8.0)

## 2017-06-18 LAB — CBC
HEMATOCRIT: 38.3 % — AB (ref 39.0–52.0)
Hemoglobin: 13.2 g/dL (ref 13.0–17.0)
MCH: 29 pg (ref 26.0–34.0)
MCHC: 34.5 g/dL (ref 30.0–36.0)
MCV: 84.2 fL (ref 78.0–100.0)
Platelets: 314 10*3/uL (ref 150–400)
RBC: 4.55 MIL/uL (ref 4.22–5.81)
RDW: 12.7 % (ref 11.5–15.5)
WBC: 9.6 10*3/uL (ref 4.0–10.5)

## 2017-06-18 LAB — BASIC METABOLIC PANEL
Anion gap: 10 (ref 5–15)
BUN: 12 mg/dL (ref 6–20)
CO2: 26 mmol/L (ref 22–32)
CREATININE: 1.19 mg/dL (ref 0.61–1.24)
Calcium: 9.3 mg/dL (ref 8.9–10.3)
Chloride: 98 mmol/L — ABNORMAL LOW (ref 101–111)
GFR calc non Af Amer: >60 mL/min (ref >60)
Glucose, Bld: 108 mg/dL — ABNORMAL HIGH (ref 65–99)
POTASSIUM: 3.9 mmol/L (ref 3.5–5.1)
SODIUM: 134 mmol/L — AB (ref 135–145)

## 2017-06-18 NOTE — ED Triage Notes (Signed)
The pt had knee surgery at [facility] last Wednesday  A foley cath was placed then and removed this past Wednesday.  No problems with urine until he woke up tonight with blood in his urine bright red and numerus blood clots no pain

## 2017-06-18 NOTE — Discharge Instructions (Signed)
We suspect that the bloody urine was a result of urethral injury from the Foley catheter placement. Normally these injuries heal on their own.  Your bleeding was likely because an existing scab dislodged.   We recommend that he continue to use the aspirin as prescribed and hydrate well. Call the urologist if the symptoms continue for more than 1 week. Return to the ER if you have severe bleeding or unable to void.

## 2017-06-18 NOTE — ED Provider Notes (Signed)
[facility] EMERGENCY DEPARTMENT Provider Note   CSN: [id number] Arrival date & time: 06/18/17  1478     History   Chief Complaint Chief Complaint  Patient presents with  . Hematuria    HPI Brian [name] is a 26 y.o. male.  HPI Patient comes in with chief complaint of bloody urine. Patient has history of anxiety and hypertension.  Patient is status post right knee surgery postop day # 5, and he had a Foley catheter placed for the surgery which was removed 3 days ago.  Patient reports that he had some gross hematuria on Friday which had resolved by Saturday. Tonight however he had spontaneous bleed around midnight.  Patient had bloody clots come out on 2 separate occasions that she decided to come to the ER for further workup.  Patient denies any pain with urination.  Patient is on aspirin full dose for DVT prophylaxis.  Past Medical History:  Diagnosis Date  . Anxiety   . Hypertension     Patient Active Problem List   Diagnosis Date Noted  . Panic attacks 10/03/2015  . Palpitations 10/03/2015  . Gynecomastia, male 10/03/2015  . Lumbar disc disease 11/01/2011    Past Surgical History:  Procedure Laterality Date  . ANTERIOR CRUCIATE LIGAMENT REPAIR    . BACK SURGERY         Home Medications    Prior to Admission medications   Medication Sig Start Date End Date Taking? Authorizing Provider  aspirin 325 MG tablet Take 325 mg daily by mouth.   Yes [provider]  docusate sodium (COLACE) 100 MG capsule Take 100 mg 2 (two) times daily by mouth.   Yes [provider]  oxyCODONE (OXY IR/ROXICODONE) 5 MG immediate release tablet Take 1 tablet every 4 (four) hours as needed by mouth for moderate pain or severe pain.  06/14/17 06/19/17 Yes [provider]  clonazePAM (KLONOPIN) 0.5 MG tablet Take 1 tablet (0.5 mg total) by mouth at bedtime. Patient not taking: Reported on 06/18/2017 04/23/17   Arfeen, Phillips Grout, MD  fluticasone  (FLONASE) 50 MCG/ACT nasal spray Place 2 sprays into both nostrils daily. Patient not taking: Reported on 06/18/2017 05/28/17   Muthersbaugh, Dahlia Client, PA-C  [name] tablet Take 2 tablets (60 mg total) by mouth daily. Patient not taking: Reported on 06/18/2017 04/23/17   Arfeen, Phillips Grout, MD    Family History Family History  Problem Relation Age of Onset  . DiGeorge syndrome Sister     Social History Social History   Tobacco Use  . Smoking status: Never Smoker  . Smokeless tobacco: Current User    Types: Snuff  Substance Use Topics  . Alcohol use: Yes    Alcohol/week: 0.0 oz  . Drug use: No     Allergies   Reglan [metoclopramide]   Review of Systems Review of Systems  Constitutional: Negative for activity change.  Endocrine: Negative for polyuria.  Genitourinary: Positive for hematuria. Negative for dysuria, penile pain and scrotal swelling.  Hematological: Does not bruise/bleed easily.     Physical Exam Updated Vital Signs BP (!) 146/102   Pulse 90   Temp 98.8 F (37.1 C) (Oral)   Resp 18   Ht 6' (1.829 m)   Wt 106.6 kg (235 lb)   SpO2 96%   BMI 31.87 kg/m   Physical Exam  Constitutional: He is oriented to person, place, and time. He appears well-developed.  HENT:  Head: Atraumatic.  Neck: Neck  supple.  Cardiovascular: Normal rate.  Pulmonary/Chest: Effort normal.  Neurological: He is alert and oriented to person, place, and time.  Skin: Skin is warm.  Nursing note and vitals reviewed.    ED Treatments / Results  Labs (all labs ordered are listed, but only abnormal results are displayed) Labs Reviewed  CBC - Abnormal; Notable for the following components:      Result Value   HCT 38.3 (*)    All other components within normal limits  URINALYSIS, ROUTINE W REFLEX MICROSCOPIC - Abnormal; Notable for the following components:   Hgb urine dipstick LARGE (*)    Protein, ur 30 (*)    All other components within normal limits  BASIC  METABOLIC PANEL - Abnormal; Notable for the following components:   Sodium 134 (*)    Chloride 98 (*)    Glucose, Bld 108 (*)    All other components within normal limits    EKG  EKG Interpretation None       Radiology No results found.  Procedures Procedures (including critical care time)  Medications Ordered in ED Medications - No data to display   Initial Impression / Assessment and Plan / ED Course  I have reviewed the triage vital signs and the nursing notes.  Pertinent labs & imaging results that were available during my care of the patient were reviewed by me and considered in my medical decision making (see chart for details).     Patient comes in with chief complaint of gross hematuria/clot passage. He had a recent surgery for which a Foley catheter was placed.  Patient's urine had cleared up for 1 day, but then he had a spontaneous rebleed today.  Patient is on aspirin for DVT prophylaxis.  Patient has no UTI-like symptoms.  I suspect that patient has urethral injury and the clot might have dislodged leading to mild spontaneous bleed.  I doubt that patient has any bleeding within the bladder given that the blood spontaneously came out.  We ordered a postvoid residual which shows 19 cc in the bladder.  The urine itself is pink in color and there were no clots that came out,  Patient is a bit anxious about the hematuria.  We advised him that most likely he is going to recover from this and is on his own.  We informed him to continue taking aspirin.  Urology information has been provided in case patient's symptoms get worse.  Strict emergency room return precautions have been discussed as well and patient is in agreement with the plan.  Final Clinical Impressions(s) / ED Diagnoses   Final diagnoses:  Gross hematuria    ED Discharge Orders    None       Derwood KaplanNanavati, Envi Eagleson, MD 06/18/17 [phone]

## 2017-06-18 NOTE — ED Notes (Signed)
Pt understood dc material. NAD noted. 

## 2017-07-17 ENCOUNTER — Other Ambulatory Visit (HOSPITAL_COMMUNITY): Payer: Self-pay

## 2017-07-17 DIAGNOSIS — F410 Panic disorder [episodic paroxysmal anxiety] without agoraphobia: Secondary | ICD-10-CM

## 2017-07-17 MED ORDER — CLONAZEPAM 0.5 MG PO TABS: 0.5000 mg | ORAL_TABLET | Freq: Every day | ORAL | 0 refills | Status: DC

## 2017-07-23 ENCOUNTER — Ambulatory Visit (HOSPITAL_COMMUNITY): Payer: Self-pay | Admitting: Psychiatry

## 2017-08-20 ENCOUNTER — Other Ambulatory Visit (HOSPITAL_COMMUNITY): Payer: Self-pay | Admitting: Psychiatry

## 2017-08-20 DIAGNOSIS — F410 Panic disorder [episodic paroxysmal anxiety] without agoraphobia: Secondary | ICD-10-CM

## 2017-08-21 ENCOUNTER — Other Ambulatory Visit (HOSPITAL_COMMUNITY): Payer: Self-pay

## 2017-08-21 DIAGNOSIS — F410 Panic disorder [episodic paroxysmal anxiety] without agoraphobia: Secondary | ICD-10-CM

## 2017-08-21 MED ORDER — CLONAZEPAM 0.5 MG PO TABS: 0.5000 mg | ORAL_TABLET | Freq: Every day | ORAL | 0 refills | Status: DC

## 2017-08-21 MED ORDER — PAROXETINE HCL 30 MG PO TABS: 60.0000 mg | ORAL_TABLET | Freq: Every day | ORAL | 0 refills | Status: DC

## 2017-09-13 ENCOUNTER — Ambulatory Visit (INDEPENDENT_AMBULATORY_CARE_PROVIDER_SITE_OTHER): Payer: 59 | Admitting: Psychiatry

## 2017-09-13 ENCOUNTER — Encounter (HOSPITAL_COMMUNITY): Payer: Self-pay | Admitting: Psychiatry

## 2017-09-13 DIAGNOSIS — F410 Panic disorder [episodic paroxysmal anxiety] without agoraphobia: Secondary | ICD-10-CM | POA: Diagnosis not present

## 2017-09-13 DIAGNOSIS — F17220 Nicotine dependence, chewing tobacco, uncomplicated: Secondary | ICD-10-CM | POA: Diagnosis not present

## 2017-09-13 DIAGNOSIS — F515 Nightmare disorder: Secondary | ICD-10-CM

## 2017-09-13 DIAGNOSIS — F419 Anxiety disorder, unspecified: Secondary | ICD-10-CM

## 2017-09-13 DIAGNOSIS — G4700 Insomnia, unspecified: Secondary | ICD-10-CM

## 2017-09-13 DIAGNOSIS — R450 Nervousness: Secondary | ICD-10-CM

## 2017-09-13 DIAGNOSIS — Z560 Unemployment, unspecified: Secondary | ICD-10-CM

## 2017-09-13 DIAGNOSIS — M2550 Pain in unspecified joint: Secondary | ICD-10-CM

## 2017-09-13 DIAGNOSIS — M25561 Pain in right knee: Secondary | ICD-10-CM | POA: Diagnosis not present

## 2017-09-13 MED ORDER — TRAZODONE HCL 50 MG PO TABS: 50.0000 mg | ORAL_TABLET | Freq: Every day | ORAL | 1 refills | Status: DC

## 2017-09-13 MED ORDER — PAROXETINE HCL 30 MG PO TABS: 60.0000 mg | ORAL_TABLET | Freq: Every day | ORAL | 1 refills | Status: DC

## 2017-09-13 MED ORDER — CLONAZEPAM 0.5 MG PO TABS: 0.5000 mg | ORAL_TABLET | Freq: Every day | ORAL | 0 refills | Status: DC

## 2017-09-13 NOTE — Progress Notes (Addendum)
[place] MD/PA/NP OP Progress Note  09/13/2017 2:59 PM CLENNON NASCA  MRN:  [id number]  Chief Complaint: I have been trouble sleeping.  I have racing thoughts.  I still have panic attacks.  HPI: Patient came for his follow-up appointment.  He is taking Paxil and Klonopin.  He started to have anxiety and panic attacks.  He had a surgery last week of October on his right knee and he is unable to go back to work.  He has evaluation on February 18 to see if he can go back to work.  Patient works at [employer].  He got injured and hurt in his right knee.  He is having nightmares flashback and bad images.  Some nights he has bad dreams and racing thoughts.  He lives with his girlfriend and 2 children.  He mentioned her relationship is going well.  He denies drinking alcohol or using any illegal substances.  He is taking Klonopin at night but it does not help sleep.  He feels his anxiety get somewhat better with the Klonopin.  He gained weight because lack of mobilization and he is not able to run and jog.  He denies any suicidal thoughts or homicidal thought.  Denies any mania or psychosis.  He denies any crying spells.  Denies any feeling of hopelessness or worthlessness.  Visit Diagnosis:    ICD-10-CM   1. 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  Past Medical History:  Past Medical History:  Diagnosis Date  . Anxiety   . Hypertension     Past Surgical History:  Procedure Laterality Date  . ANTERIOR CRUCIATE LIGAMENT REPAIR    . BACK SURGERY      Family Psychiatric History: Reviewed.  Family History:  Family History  Problem Relation Age of Onset  . DiGeorge syndrome Sister     Social History:  Social History   Socioeconomic History  . Marital status: Single    Spouse name: Not on file  . Number of children: Not on file  . Years of  education: Not on file  . Highest education level: Not on file  Social Needs  . Financial resource strain: Not on file  . Food insecurity - worry: Not on file  . Food insecurity - inability: Not on file  . Transportation needs - medical: Not on file  . Transportation needs - non-medical: Not on file  Occupational History  . Not on file  Tobacco Use  . Smoking status: Never Smoker  . Smokeless tobacco: Current User    Types: Snuff  Substance and Sexual Activity  . Alcohol use: Yes    Alcohol/week: 0.0 oz  . Drug use: No  . Sexual activity: Yes    Birth control/protection: Condom, Pill  Other Topics Concern  . Not on file  Social History Narrative  . Not on file    Allergies:  Allergies  Allergen Reactions  . Reglan [Metoclopramide] Other (See Comments)    Sweating and hot sensation in jaws an genital area     Metabolic Disorder Labs: Recent Results (from the past 2160 hour(s))  CBC     Status: Abnormal   Collection Time: 06/18/17  2:51 AM  Result Value Ref Range   WBC 9.6 4.0 - 10.5 K/uL   RBC 4.55 4.22 - 5.81 MIL/uL   Hemoglobin 13.2 13.0 -  17.0 g/dL   HCT 38.3 (L) 39.0 - 52.0 %   MCV 84.2 78.0 - 100.0 fL   MCH 29.0 26.0 - 34.0 pg   MCHC 34.5 30.0 - 36.0 g/dL   RDW 12.7 11.5 - 15.5 %   Platelets 314 150 - 400 K/uL  Basic metabolic panel     Status: Abnormal   Collection Time: 06/18/17  2:51 AM  Result Value Ref Range   Sodium 134 (L) 135 - 145 mmol/L   Potassium 3.9 3.5 - 5.1 mmol/L   Chloride 98 (L) 101 - 111 mmol/L   CO2 26 22 - 32 mmol/L   Glucose, Bld 108 (H) 65 - 99 mg/dL   BUN 12 6 - 20 mg/dL   Creatinine, Ser 1.19 0.61 - 1.24 mg/dL   Calcium 9.3 8.9 - 10.3 mg/dL   GFR calc non Af Amer >60 >60 mL/min   GFR calc Af Amer >60 >60 mL/min    Comment: (NOTE) The eGFR has been calculated using the CKD EPI equation. This calculation has not been validated in all clinical situations. eGFR's persistently <60 mL/min signify possible Chronic Kidney Disease.     Anion gap 10 5 - 15  Urinalysis, Routine w reflex microscopic     Status: Abnormal   Collection Time: 06/18/17  4:34 AM  Result Value Ref Range   Color, Urine YELLOW YELLOW   APPearance CLEAR CLEAR   Specific Gravity, Urine 1.024 1.005 - 1.030   pH 6.0 5.0 - 8.0   Glucose, UA NEGATIVE NEGATIVE mg/dL   Hgb urine dipstick LARGE (A) NEGATIVE   Bilirubin Urine NEGATIVE NEGATIVE   Ketones, ur NEGATIVE NEGATIVE mg/dL   Protein, ur 30 (A) NEGATIVE mg/dL   Nitrite NEGATIVE NEGATIVE   Leukocytes, UA NEGATIVE NEGATIVE   RBC / HPF TOO NUMEROUS TO COUNT 0 - 5 RBC/hpf   WBC, UA 6-30 0 - 5 WBC/hpf   Bacteria, UA NONE SEEN NONE SEEN   Squamous Epithelial / LPF NONE SEEN NONE SEEN   Mucus PRESENT    No results found for: HGBA1C, MPG No results found for: PROLACTIN No results found for: CHOL, TRIG, HDL, CHOLHDL, VLDL, LDLCALC No results found for: TSH  Therapeutic Level Labs: No results found for: LITHIUM No results found for: VALPROATE No components found for:  CBMZ  Current Medications: Current Outpatient Medications  Medication Sig Dispense Refill  . aspirin 325 MG tablet Take 325 mg daily by mouth.    . clonazePAM (KLONOPIN) 0.5 MG tablet Take 1 tablet (0.5 mg total) by mouth at bedtime. 30 tablet 0  . docusate sodium (COLACE) 100 MG capsule Take 100 mg 2 (two) times daily by mouth.    . fluticasone (FLONASE) 50 MCG/ACT nasal spray Place 2 sprays into both nostrils daily. (Patient not taking: Reported on 06/18/2017) 9.9 g 2  . [name] tablet Take 2 tablets (60 mg total) by mouth daily. 60 tablet 0   No current facility-administered medications for this visit.      Musculoskeletal: Strength & Muscle Tone: within normal limits Gait & Station: normal Patient leans: N/A  Psychiatric Specialty Exam: Review of Systems  Constitutional: Negative for weight loss.  Musculoskeletal: Positive for joint pain.  Psychiatric/Behavioral: The patient is nervous/anxious and  has insomnia.     Blood pressure 122/80, pulse 89, height 5' 11"  (1.803 m), weight 245 lb (111.1 kg).Body mass index is 34.17 kg/m.  General Appearance: Casual  Eye Contact:  Fair  Speech:  Clear  and Coherent  Volume:  Normal  Mood:  Anxious  Affect:  Constricted  Thought Process:  Goal Directed  Orientation:  Full (Time, Place, and Person)  Thought Content: Rumination   Suicidal Thoughts:  No  Homicidal Thoughts:  No  Memory:  Immediate;   Good Recent;   Good Remote;   Good  Judgement:  Good  Insight:  Good  Psychomotor Activity:  Normal  Concentration:  Concentration: Good and Attention Span: Good  Recall:  Good  Fund of Knowledge: Good  Language: Good  Akathisia:  No  Handed:  Right  AIMS (if indicated): not done  Assets:  Communication Skills Desire for Improvement Housing Resilience  ADL's:  Intact  Cognition: WNL  Sleep:  Fair   Screenings: PHQ2-9     Office Visit from 11/10/2016 in Primary Care at [facility] from 10/03/2015 in Primary Care at [facility] Total Score  0  0       Assessment and Plan: Generalized anxiety disorder.  Panic disorder without agoraphobia.  Patient started to have increased anxiety and nervousness.  He is not able to go back to work since he had a knee surgery.  His major issue is lack of sleep.  I recommended to try trazodone 50 mg at bedtime for insomnia and take Klonopin during the day for anxiety and panic attacks.  Continue Paxil 60 mg daily.  Patient has appointment on February 18 for evaluation to see if he can go back to work.  Reassurance given.  Patient is not interested in counseling.  Recommended to call us back if is any question or any concern.  Encouraged to watch his calorie intake since he gained weight.  Follow-up in 2 months.   Kathlee Nations, MD 09/13/2017, 2:59 PM

## 2017-09-28 DIAGNOSIS — I10 Essential (primary) hypertension: Secondary | ICD-10-CM | POA: Diagnosis not present

## 2017-09-28 DIAGNOSIS — L02225 Furuncle of perineum: Secondary | ICD-10-CM | POA: Diagnosis not present

## 2017-10-12 ENCOUNTER — Other Ambulatory Visit (HOSPITAL_COMMUNITY): Payer: Self-pay | Admitting: Psychiatry

## 2017-10-12 DIAGNOSIS — F410 Panic disorder [episodic paroxysmal anxiety] without agoraphobia: Secondary | ICD-10-CM

## 2017-10-15 ENCOUNTER — Other Ambulatory Visit (HOSPITAL_COMMUNITY): Payer: Self-pay

## 2017-10-15 DIAGNOSIS — F410 Panic disorder [episodic paroxysmal anxiety] without agoraphobia: Secondary | ICD-10-CM

## 2017-10-15 MED ORDER — PAROXETINE HCL 30 MG PO TABS: 60.0000 mg | ORAL_TABLET | Freq: Every day | ORAL | 0 refills | Status: DC

## 2017-10-15 MED ORDER — CLONAZEPAM 0.5 MG PO TABS: 0.5000 mg | ORAL_TABLET | Freq: Every day | ORAL | 0 refills | Status: DC

## 2017-10-29 DIAGNOSIS — I10 Essential (primary) hypertension: Secondary | ICD-10-CM | POA: Diagnosis not present

## 2017-11-08 ENCOUNTER — Ambulatory Visit (INDEPENDENT_AMBULATORY_CARE_PROVIDER_SITE_OTHER): Payer: 59 | Admitting: Psychiatry

## 2017-11-08 ENCOUNTER — Encounter (HOSPITAL_COMMUNITY): Payer: Self-pay | Admitting: Psychiatry

## 2017-11-08 DIAGNOSIS — F411 Generalized anxiety disorder: Secondary | ICD-10-CM | POA: Diagnosis not present

## 2017-11-08 DIAGNOSIS — F410 Panic disorder [episodic paroxysmal anxiety] without agoraphobia: Secondary | ICD-10-CM | POA: Diagnosis not present

## 2017-11-08 MED ORDER — CLONAZEPAM 0.5 MG PO TABS: ORAL_TABLET | ORAL | 2 refills | Status: DC

## 2017-11-08 MED ORDER — PAROXETINE HCL 30 MG PO TABS: 60.0000 mg | ORAL_TABLET | Freq: Every day | ORAL | 2 refills | Status: DC

## 2017-11-08 NOTE — Progress Notes (Signed)
BH MD/PA/NP OP Progress Note  11/08/2017 3:05 PM Brian [name]  MRN:  [id number]  Chief Complaint: I am doing better.  I try trazodone but it is making him very groggy and sleepy.  But I am sleeping better.  I started working.  HPI: Patient came for his follow-up appointment.  On his last visit we started him on trazodone because he was complaining of poor sleep and nightmares but he could not tolerate the dose very well.  He reported excessive sedation and feeling groggy next day and decided to stop the trazodone.  He is sleeping better.  He resume his work on March 11.  He still have knee pain and sometimes difficulty ambulating but denies any panic attack or any crying spells.  He still have some time dreams but they are not as bad.  He lives with his girlfriend and 2 children.  Patient told his relationship is going well.  He denies drinking alcohol or using any illegal substances.  He is taking Klonopin and Paxil.  He has no tremors, shakes or any EPS.  He denies any paranoia or any suicidal thoughts.  Visit Diagnosis:    ICD-10-CM   1. Panic disorder without agoraphobia F41.0 clonazePAM (KLONOPIN) 0.5 MG tablet    [name] tablet    Past Psychiatric History: Reviewed. Patient had history of panic attacks and prescribed valium by Primary care physician Dr Corky SoxVeeta Bland. [facility] Patient was given Ambien, Xanax and P[facility]il [facility]. Patient denies any history of psychiatric inpatient treatment, psychosis, suicidal attempt, paranoia, self abusive behavior or any hallucination. He denies any history of physical, sexual or any verbal abuse.   Past Medical History:  Past Medical History:  Diagnosis Date  . Anxiety   . Hypertension     Past Surgical History:  Procedure Laterality Date  . ANTERIOR CRUCIATE LIGAMENT REPAIR    . BACK SURGERY      Family Psychiatric History: Viewed  Family  History:  Family History  Problem Relation Age of Onset  . DiGeorge syndrome Sister     Social History:  Social History   Socioeconomic History  . Marital status: Single    Spouse name: Not on file  . Number of children: Not on file  . Years of education: Not on file  . Highest education level: Not on file  Occupational History  . Not on file  Social Needs  . Financial resource strain: Not on file  . Food insecurity:    Worry: Not on file    Inability: Not on file  . Transportation needs:    Medical: Not on file    Non-medical: Not on file  Tobacco Use  . Smoking status: Never Smoker  . Smokeless tobacco: Current User    Types: Snuff  Substance and Sexual Activity  . Alcohol use: Yes    Alcohol/week: 0.0 oz  . Drug use: No  . Sexual activity: Yes    Birth control/protection: Condom, Pill  Lifestyle  . Physical activity:    Days per week: Not on file    Minutes per session: Not on file  . Stress: Not on file  Relationships  . Social connections:    Talks on phone: Not on file    Gets together: Not on file    Attends religious service: Not on file    Active member of club or organization: Not on file  Attends meetings of clubs or organizations: Not on file    Relationship status: Not on file  Other Topics Concern  . Not on file  Social History Narrative  . Not on file    Allergies:  Allergies  Allergen Reactions  . Reglan [Metoclopramide] Other (See Comments)    Sweating and hot sensation in jaws an genital area     Metabolic Disorder Labs: No results found for: HGBA1C, MPG No results found for: PROLACTIN No results found for: CHOL, TRIG, HDL, CHOLHDL, VLDL, LDLCALC No results found for: TSH  Therapeutic Level Labs: No results found for: LITHIUM No results found for: VALPROATE No components found for:  CBMZ  Current Medications: Current Outpatient Medications  Medication Sig Dispense Refill  . aspirin 325 MG tablet Take 325 mg daily by mouth.     . clonazePAM (KLONOPIN) 0.5 MG tablet Take 1 tablet (0.5 mg total) by mouth at bedtime. 30 tablet 0  . docusate sodium (COLACE) 100 MG capsule Take 100 mg 2 (two) times daily by mouth.    . fluticasone (FLONASE) 50 MCG/ACT nasal spray Place 2 sprays into both nostrils daily. (Patient not taking: Reported on 06/18/2017) 9.9 g 2  . [name] tablet Take 2 tablets (60 mg total) by mouth daily. 60 tablet 0  . traZODone (DESYREL) 50 MG tablet Take 1 tablet (50 mg total) by mouth at bedtime. 30 tablet 1   No current facility-administered medications for this visit.      Musculoskeletal: Strength & Muscle Tone: within normal limits Gait & Station: normal Patient leans: N/A  Psychiatric Specialty Exam: ROS  Blood pressure 132/78, pulse 82, height 6' (1.829 m), weight 258 lb 12.8 oz (117.4 kg).There is no height or weight on file to calculate BMI.  General Appearance: Casual  Eye Contact:  Good  Speech:  Clear and Coherent  Volume:  Normal  Mood:  Anxious  Affect:  Congruent  Thought Process:  Goal Directed  Orientation:  Full (Time, Place, and Person)  Thought Content: Logical   Suicidal Thoughts:  No  Homicidal Thoughts:  No  Memory:  Immediate;   Good Recent;   Good Remote;   Good  Judgement:  Good  Insight:  Good  Psychomotor Activity:  Normal  Concentration:  Concentration: Good and Attention Span: Good  Recall:  Good  Fund of Knowledge: Good  Language: Good  Akathisia:  No  Handed:  Right  AIMS (if indicated): not done  Assets:  Communication Skills Desire for Improvement Housing Physical Health Resilience Social Support  ADL's:  Intact  Cognition: WNL  Sleep:  Fair   Screenings: PHQ2-9     Office Visit from 11/10/2016 in Primary Care at [place] Office Visit from 10/03/2015 in [facility] Care at [facility] Total Score  0  0       Assessment and Plan: Panic disorder, Generalized anxiety disorder.  I will discontinue trazodone as patient is not  taking it.  Recommended to try [name] 1-2 tablet as needed for anxiety and panic attack.  Continue Paxil 60 mg daily.  Encourage to watch his calorie intake and do regular exercise.  Discussed healthy lifestyle.  Patient resume work on March 11 and trying to adjust in his daily routines.  Recommended to call us back if he has any question or any concern.  Follow-up in 3 months.     Cleotis Nipper, MD 11/08/2017, 3:05 PM

## 2018-02-07 ENCOUNTER — Ambulatory Visit (HOSPITAL_COMMUNITY): Payer: 59 | Admitting: Psychiatry

## 2018-02-19 DIAGNOSIS — R1032 Left lower quadrant pain: Secondary | ICD-10-CM | POA: Diagnosis not present

## 2018-02-19 DIAGNOSIS — J391 Other abscess of pharynx: Secondary | ICD-10-CM | POA: Diagnosis not present

## 2018-02-19 DIAGNOSIS — K625 Hemorrhage of anus and rectum: Secondary | ICD-10-CM | POA: Diagnosis not present

## 2018-02-19 DIAGNOSIS — I10 Essential (primary) hypertension: Secondary | ICD-10-CM | POA: Diagnosis not present

## 2018-02-19 DIAGNOSIS — K623 Rectal prolapse: Secondary | ICD-10-CM | POA: Diagnosis not present

## 2018-02-20 ENCOUNTER — Emergency Department (HOSPITAL_COMMUNITY)
Admission: EM | Admit: 2018-02-20 | Discharge: 2018-02-20 | Disposition: A | Discharge status: Home / Self Care | Payer: 59 | Attending: Emergency Medicine | Admitting: Emergency Medicine

## 2018-02-20 ENCOUNTER — Other Ambulatory Visit: Payer: Self-pay

## 2018-02-20 ENCOUNTER — Encounter (HOSPITAL_COMMUNITY): Payer: Self-pay

## 2018-02-20 DIAGNOSIS — K625 Hemorrhage of anus and rectum: Secondary | ICD-10-CM | POA: Diagnosis not present

## 2018-02-20 DIAGNOSIS — Z79899 Other long term (current) drug therapy: Secondary | ICD-10-CM | POA: Insufficient documentation

## 2018-02-20 DIAGNOSIS — F17220 Nicotine dependence, chewing tobacco, uncomplicated: Secondary | ICD-10-CM | POA: Diagnosis not present

## 2018-02-20 DIAGNOSIS — Z7982 Long term (current) use of aspirin: Secondary | ICD-10-CM | POA: Diagnosis not present

## 2018-02-20 DIAGNOSIS — I10 Essential (primary) hypertension: Secondary | ICD-10-CM | POA: Insufficient documentation

## 2018-02-20 LAB — CBC
HEMATOCRIT: 48.8 % (ref 39.0–52.0)
Hemoglobin: 15.9 g/dL (ref 13.0–17.0)
MCH: 29.1 pg (ref 26.0–34.0)
MCHC: 32.6 g/dL (ref 30.0–36.0)
MCV: 89.2 fL (ref 78.0–100.0)
Platelets: 233 10*3/uL (ref 150–400)
RBC: 5.47 MIL/uL (ref 4.22–5.81)
RDW: 12.9 % (ref 11.5–15.5)
WBC: 5.9 10*3/uL (ref 4.0–10.5)

## 2018-02-20 LAB — COMPREHENSIVE METABOLIC PANEL
ALT: 37 U/L (ref 0–44)
AST: 33 U/L (ref 15–41)
Albumin: 4.2 g/dL (ref 3.5–5.0)
Alkaline Phosphatase: 92 U/L (ref 38–126)
Anion gap: 7 (ref 5–15)
BUN: 10 mg/dL (ref 6–20)
CHLORIDE: 104 mmol/L (ref 98–111)
CO2: 29 mmol/L (ref 22–32)
CREATININE: 1.04 mg/dL (ref 0.61–1.24)
Calcium: 9.6 mg/dL (ref 8.9–10.3)
GFR calc Af Amer: >60 mL/min (ref >60)
Glucose, Bld: 79 mg/dL (ref 70–99)
POTASSIUM: 3.8 mmol/L (ref 3.5–5.1)
SODIUM: 140 mmol/L (ref 135–145)
Total Bilirubin: 0.4 mg/dL (ref 0.3–1.2)
Total Protein: 7.5 g/dL (ref 6.5–8.1)

## 2018-02-20 MED ORDER — PSYLLIUM 0.52 G PO CAPS: 0.5200 g | ORAL_CAPSULE | Freq: Every day | ORAL | 0 refills | Status: DC

## 2018-02-20 NOTE — Discharge Instructions (Signed)
Please read attached information. If you experience any new or worsening signs or symptoms please return to the emergency room for evaluation. Please follow-up with your primary care provider or specialist as discussed. Please use medication prescribed only as directed and discontinue taking if you have any concerning signs or symptoms.   °

## 2018-02-20 NOTE — ED Provider Notes (Signed)
[facility] EM[facility]GENC[facility] Provider Note   CSN: [id number] Arrival date & time: 02/20/18  1431     History   Chief Complaint Chief Complaint  Patient presents with  . Rectal Bleeding    HPI Brian [name] is a 27 y.o. male.  HPI   27 year old male presents to complaints of rectal bleeding. Patient notes yesterday also and down to have a bowel movement he noted red blood (. Patient notes normal bowel movement with no dark or tarry stools. Patient notes again he had similar symptoms today while sitting down to use the restroom. Patient notes that he did follow-up with his primary care provider who did a rectal exam with no acute findings. He has an outpatient CT ordered and follow-up with gastroenterology ordered. Patient denies any history of bleeding disorders, denies any significant abdominal pain, abnormal bruising or bleeding. Patient reports normal bowel movements with no constipation. No history of hemorrhoids.  Past Medical History:  Diagnosis Date  . Anxiety   . Hypertension     Patient Active Problem List   Diagnosis Date Noted  . Panic attacks 10/03/2015  . Palpitations 10/03/2015  . Gynecomastia, male 10/03/2015  . Lumbar disc disease 11/01/2011    Past Surgical History:  Procedure Laterality Date  . ANTERIOR CRUCIATE LIGAMENT REPAIR    . BACK SURGERY          Home Medications    Prior to Admission medications   Medication Sig Start Date End Date Taking? Authorizing Provider  aspirin 325 MG tablet Take 325 mg daily by mouth.    [provider]  clonazePAM (KLONOPIN) 0.5 MG tablet Take 1-2 tab daily as needed for sleep and panic attck 11/08/17   Arfeen, Phillips GroutSyed T, MD  docusate sodium (COLACE) 100 MG capsule Take 100 mg 2 (two) times daily by mouth.    [provider]  fluticasone (FLONASE) 50 MCG/ACT nasal spray Place 2 sprays into both nostrils daily. Patient not taking: Reported on 06/18/2017 05/28/17   Muthersbaugh,  Dahlia ClientHannah, PA-C  [name] tablet Take 2 tablets (60 mg total) by mouth daily. 11/08/17   Arfeen, Phillips GroutSyed T, MD  psyllium (METAMUCIL) 0.52 g capsule Take 1 capsule (0.52 g total) by mouth daily. 02/20/18   Eyvonne MechanicHedges, Shatasha Lambing, PA-C    Family History Family History  Problem Relation Age of Onset  . DiGeorge syndrome Sister     Social History Social History   Tobacco Use  . Smoking status: Never Smoker  . Smokeless tobacco: Current User    Types: Snuff  Substance Use Topics  . Alcohol use: Yes    Alcohol/week: 0.0 oz    Comment: occ  . Drug use: No     Allergies   Reglan [metoclopramide]   Review of Systems Review of Systems  All other systems reviewed and are negative.  Physical Exam Updated Vital Signs BP 124/84   Pulse 72   Temp 98.7 F (37.1 C) (Oral)   Resp 16   Ht 5\' 11"  (1.803 m)   Wt 113.4 kg (250 lb)   SpO2 100%   BMI 34.87 kg/m   Physical Exam  Constitutional: He is oriented to person, place, and time. He appears well-developed and well-nourished.  HENT:  Head: Normocephalic and atraumatic.  Eyes: Pupils are equal, round, and reactive to light. Conjunctivae are normal. Right eye exhibits no discharge. Left eye exhibits no discharge. No scleral icterus.  Neck: Normal range of motion. No JVD present. No  tracheal deviation present.  Pulmonary/Chest: Effort normal. No stridor.  Abdominal: Soft. He exhibits no distension and no mass. There is no tenderness. There is no rebound and no guarding. No hernia.  Genitourinary:  Genitourinary Comments: Red blood noted around the anus, internal exam shows no hemorrhoids masses or tenderness, no fissures noted no external hemorrhoids noted  Neurological: He is alert and oriented to person, place, and time. Coordination normal.  Psychiatric: He has a normal mood and affect. His behavior is normal. Judgment and thought content normal.  Nursing note and vitals reviewed.    ED Treatments / Results  Labs (all  labs ordered are listed, but only abnormal results are displayed) Labs Reviewed  COMPREHENSIVE METABOLIC PANEL  CBC    EKG None  Radiology No results found.  Procedures Procedures (including critical care time)  Medications Ordered in ED Medications - No data to display   Initial Impression / Assessment and Plan / ED Course  I have reviewed the triage vital signs and the nursing notes.  Pertinent labs & imaging results that were available during my care of the patient were reviewed by me and considered in my medical decision making (see chart for details).     Labs: CMP, CBC   Imaging:  Consults:  Therapeutics:  Discharge Meds: Metamucil  Assessment/Plan: 27 year old male presents today with rectal bleeding. Patient is well-appearing in no acute distress, is no abdominal pain no fever, no physical findings other than blood noted on rectal exam. Low suspicion for upper GI bleed, no brisk lower GI bleed. Given patient's risk factors low suspicion for acute malignant etiology.low suspicion for ulcers, fissures, question polyps. Patient will be started on Metamucil, he will follow-up as an outpatient with his primary care as previously planned. Patient will return immediately if he develops any new or worsening signs or symptoms, patient verbalized understanding and agreement to today's plan had no further questions or concerns.    Final Clinical Impressions(s) / ED Diagnoses   Final diagnoses:  Rectal bleeding    ED Discharge Orders        Ordered    psyllium (METAMUCIL) 0.52 g capsule  Daily     02/20/18 1809       Eyvonne Mechanic, PA-C 02/20/18 1850    Melene Plan, DO 02/21/18 1209

## 2018-02-20 NOTE — ED Triage Notes (Addendum)
Pt endorses rectal bleeding that began yesterday, pt sat down to have a bm and blood was dripping into toilet, had regular BM, then blood continued to drip afterward, went to see pcp and had a positive occult. Pt states that it has continued today. Pt takes 1600mg  of ibuprofen per day for knee pain. VSS.

## 2018-02-28 ENCOUNTER — Encounter: Payer: Self-pay | Admitting: Gastroenterology

## 2018-04-01 ENCOUNTER — Other Ambulatory Visit (HOSPITAL_COMMUNITY): Payer: Self-pay

## 2018-04-01 DIAGNOSIS — F410 Panic disorder [episodic paroxysmal anxiety] without agoraphobia: Secondary | ICD-10-CM

## 2018-04-12 ENCOUNTER — Ambulatory Visit (HOSPITAL_COMMUNITY): Payer: Self-pay | Admitting: Psychiatry

## 2018-04-22 DIAGNOSIS — M25561 Pain in right knee: Secondary | ICD-10-CM | POA: Diagnosis not present

## 2018-04-22 DIAGNOSIS — Z6835 Body mass index (BMI) 35.0-35.9, adult: Secondary | ICD-10-CM | POA: Diagnosis not present

## 2018-04-25 ENCOUNTER — Encounter: Payer: Self-pay | Admitting: Gastroenterology

## 2018-04-25 ENCOUNTER — Ambulatory Visit: Payer: 59 | Admitting: Gastroenterology

## 2018-04-25 VITALS — BP 124/86 | HR 74 | Ht 71.0 in | Wt 256.0 lb

## 2018-04-25 DIAGNOSIS — Z800 Family history of malignant neoplasm of digestive organs: Secondary | ICD-10-CM | POA: Diagnosis not present

## 2018-04-25 DIAGNOSIS — R1013 Epigastric pain: Secondary | ICD-10-CM

## 2018-04-25 DIAGNOSIS — K5909 Other constipation: Secondary | ICD-10-CM

## 2018-04-25 DIAGNOSIS — R12 Heartburn: Secondary | ICD-10-CM

## 2018-04-25 DIAGNOSIS — K625 Hemorrhage of anus and rectum: Secondary | ICD-10-CM | POA: Diagnosis not present

## 2018-04-25 MED ORDER — OMEPRAZOLE 20 MG PO CPDR: 20.0000 mg | DELAYED_RELEASE_CAPSULE | Freq: Every day | ORAL | 0 refills | Status: DC

## 2018-04-25 MED ORDER — NA SULFATE-K SULFATE-MG SULF 17.5-3.13-1.6 GM/177ML PO SOLN: 1.0000 | Freq: Once | ORAL | 0 refills | Status: AC

## 2018-04-25 NOTE — Patient Instructions (Signed)
You have been scheduled for a colonoscopy. Please follow written instructions given to you at your visit today.  Please pick up your prep supplies at the pharmacy within the next 1-3 days. If you use inhalers (even only as needed), please bring them with you on the day of your procedure. Your physician has requested that you go to www.startemmi.com and enter the access code given to you at your visit today. This web site gives a general overview about your procedure. However, you should still follow specific instructions given to you by our office regarding your preparation for the procedure.  AVOID NSAIDS  We have sent omeprazole 20 mg daily for 2 months  Take MIRALAX 1 capful daily for constipation   Constipation, Adult Constipation is when a person:  Poops (has a bowel movement) fewer times in a week than normal.  Has a hard time pooping.  Has poop that is dry, hard, or bigger than normal.  Follow these instructions at home: Eating and drinking   Eat foods that have a lot of fiber, such as: ? Fresh fruits and vegetables. ? Whole grains. ? Beans.  Eat less of foods that are high in fat, low in fiber, or overly processed, such as: ? JamaicaFrench fries. ? Hamburgers. ? Cookies. ? Candy. ? Soda.  Drink enough fluid to keep your pee (urine) clear or pale yellow. General instructions  Exercise regularly or as told by your doctor.  Go to the restroom when you feel like you need to poop. Do not hold it in.  Take over-the-counter and prescription medicines only as told by your doctor. These include any fiber supplements.  Do pelvic floor retraining exercises, such as: ? Doing deep breathing while relaxing your lower belly (abdomen). ? Relaxing your pelvic floor while pooping.  Watch your condition for any changes.  Keep all follow-up visits as told by your doctor. This is important. Contact a doctor if:  You have pain that gets worse.  You have a fever.  You have not  pooped for 4 days.  You throw up (vomit).  You are not hungry.  You lose weight.  You are bleeding from the anus.  You have thin, pencil-like poop (stool). Get help right away if:  You have a fever, and your symptoms suddenly get worse.  You leak poop or have blood in your poop.  Your belly feels hard or bigger than normal (is bloated).  You have very bad belly pain.  You feel dizzy or you faint. This information is not intended to replace advice given to you by your health care provider. Make sure you discuss any questions you have with your health care provider. Document Released: 01/17/2008 Document Revised: 02/18/2016 Document Reviewed: 01/19/2016 Elsevier Interactive Patient Education  2018 [employer]  Thank you for choosing [facility]  Kavitha Nandigam,MD

## 2018-04-25 NOTE — Progress Notes (Signed)
Brian LekCarl R Bathgate    [id number]    1990-11-19  Primary Care Physician:Bland, Adrian SaranVeita, MD  Referring Physician: Renaye RakersBland, Veita, MD [address] [place] [id number]  Chief complaint: Rectal bleeding  HPI: 27 year old male here with complaints of rectal bleeding.  He had an episode about 2 months ago he noticed bright red blood per rectum after a bowel movement, was in the toilet bowl and also when he wiped. Prior to the episode of rectal bleeding he was having significant abdominal pain that lasted for about 30 to 40 minutes and next day, after he had a bowel movement, noticed bright red blood per rectum. Denies loss of appetite, weight loss, diarrhea, nausea, vomiting, melena or abdominal pain.  He has intermittent constipation with irregular bowel habits.  Been having on and off epigastric discomfort and heartburn. He was taking 800mg  ibuprofen BID to TID for past 8 month, stopped in July 2019  Paternal aunt Breast cancer Paternal grandfather had colon cancer   Outpatient Encounter Medications as of 04/25/2018  Medication Sig  . aspirin 325 MG tablet Take 325 mg daily by mouth.  . clonazePAM (KLONOPIN) 0.5 MG tablet Take 1-2 tab daily as needed for sleep and panic attck  . docusate sodium (COLACE) 100 MG capsule Take 100 mg 2 (two) times daily by mouth.  . fluticasone (FLONASE) 50 MCG/ACT nasal spray Place 2 sprays into both nostrils daily. (Patient not taking: Reported on 06/18/2017)  . [name] tablet Take 2 tablets (60 mg total) by mouth daily.  . psyllium (METAMUCIL) 0.52 g capsule Take 1 capsule (0.52 g total) by mouth daily.   No facility-administered encounter medications on file as of 04/25/2018.     Allergies as of 04/25/2018 - Review Complete 02/20/2018  Allergen Reaction Noted  . Reglan [metoclopramide] Other (See Comments) 02/12/2014    Past Medical History:  Diagnosis Date  . Anxiety   . Hypertension     Past Surgical History:    Procedure Laterality Date  . ANTERIOR CRUCIATE LIGAMENT REPAIR    . BACK SURGERY      Family History  Problem Relation Age of Onset  . DiGeorge syndrome Sister     Social History   Socioeconomic History  . Marital status: Single    Spouse name: Not on file  . Number of children: Not on file  . Years of education: Not on file  . Highest education level: Not on file  Occupational History  . Not on file  Social Needs  . Financial resource strain: Not on file  . Food insecurity:    Worry: Not on file    Inability: Not on file  . Transportation needs:    Medical: Not on file    Non-medical: Not on file  Tobacco Use  . Smoking status: Never Smoker  . Smokeless tobacco: Current User    Types: Snuff  Substance and Sexual Activity  . Alcohol use: Yes    Alcohol/week: 0.0 standard drinks    Comment: occ  . Drug use: No  . Sexual activity: Yes    Birth control/protection: Condom, Pill  Lifestyle  . Physical activity:    Days per week: Not on file    Minutes per session: Not on file  . Stress: Not on file  Relationships  . Social connections:    Talks on phone: Not on file    Gets together: Not on file  Attends religious service: Not on file    Active member of club or organization: Not on file    Attends meetings of clubs or organizations: Not on file    Relationship status: Not on file  . Intimate partner violence:    Fear of current or ex partner: Not on file    Emotionally abused: Not on file    Physically abused: Not on file    Forced sexual activity: Not on file  Other Topics Concern  . Not on file  Social History Narrative  . Not on file      Review of systems: Review of Systems  Constitutional: Negative for fever and chills.  HENT: Negative.   Eyes: Negative for blurred vision.  Respiratory: Negative for cough, shortness of breath and wheezing.   Cardiovascular: Negative for chest pain and palpitations.  Gastrointestinal: as per  HPI Genitourinary: Negative for dysuria, urgency, frequency and hematuria.  Musculoskeletal: Negative for myalgias, back pain and joint pain.  Skin: Negative for itching and rash.  Neurological: Negative for dizziness, tremors, focal weakness, seizures and loss of consciousness.  Endo/Heme/Allergies: Positive for seasonal allergies.  Psychiatric/Behavioral: Negative for depression, suicidal ideas and hallucinations.  All other systems reviewed and are negative.   Physical Exam: Vitals:   04/25/18 0930  BP: 124/86  Pulse: 74  SpO2: 98%   Body mass index is 35.7 kg/m. Gen:      No acute distress HEENT:  EOMI, sclera anicteric Neck:     No masses; no thyromegaly Lungs:    Clear to auscultation bilaterally; normal respiratory effort CV:         Regular rate and rhythm; no murmurs Abd:      + bowel sounds; soft, non-tender; no palpable masses, no distension Ext:    No edema; adequate peripheral perfusion Skin:      Warm and dry; no rash Neuro: alert and oriented x 3 Psych: normal mood and affect  Data Reviewed:  Reviewed labs, radiology imaging, old records and pertinent past GI work up   Assessment and Plan/Recommendations:  27 year old male here with complaints of bright red blood per rectum, family history of colon cancer in distant relative (grandfather) Most likely etiology small-volume bleeding from internal hemorrhoids but will need to exclude neoplastic lesion schedule for colonoscopy  The risks and benefits as well as alternatives of endoscopic procedure(s) have been discussed and reviewed. All questions answered. The patient agrees to proceed.  Epigastric abdominal pain and intermittent heartburn History of chronic NSAID use Start omeprazole 20 mg daily Avoid NSAIDs Antireflux measures If no improvement within the next 6 to 8 weeks will consider EGD for further evaluation  Irregular bowel habits with constipation Increase dietary fiber and fluid intake Start  MiraLAX 1 capful daily    Iona Beard , MD [phone]    CC: Renaye Rakers, MD

## 2018-05-03 ENCOUNTER — Encounter: Payer: Self-pay | Admitting: Gastroenterology

## 2018-05-08 ENCOUNTER — Encounter: Payer: Self-pay | Admitting: Gastroenterology

## 2018-05-20 ENCOUNTER — Telehealth: Payer: Self-pay | Admitting: Gastroenterology

## 2018-05-20 NOTE — Telephone Encounter (Signed)
ok 

## 2018-05-22 ENCOUNTER — Encounter: Payer: Self-pay | Admitting: Gastroenterology

## 2018-05-24 DIAGNOSIS — Z6836 Body mass index (BMI) 36.0-36.9, adult: Secondary | ICD-10-CM | POA: Diagnosis not present

## 2018-05-24 DIAGNOSIS — M25561 Pain in right knee: Secondary | ICD-10-CM | POA: Diagnosis not present

## 2018-05-27 ENCOUNTER — Other Ambulatory Visit (HOSPITAL_COMMUNITY): Payer: Self-pay

## 2018-05-27 DIAGNOSIS — F410 Panic disorder [episodic paroxysmal anxiety] without agoraphobia: Secondary | ICD-10-CM

## 2018-05-27 MED ORDER — CLONAZEPAM 0.5 MG PO TABS: ORAL_TABLET | ORAL | 0 refills | Status: DC

## 2018-05-27 MED ORDER — PAROXETINE HCL 30 MG PO TABS: 60.0000 mg | ORAL_TABLET | Freq: Every day | ORAL | 0 refills | Status: DC

## 2018-05-29 ENCOUNTER — Encounter: Payer: Self-pay | Admitting: Gastroenterology

## 2018-05-29 NOTE — Telephone Encounter (Signed)
Patient canceled his procedure for today 10.16.19. Patient states he had knee surgery and is having problems and had to go to the doctor today for emergency visit...pt states he will cb to resch. Second late cancellation. FYI

## 2018-05-29 NOTE — Telephone Encounter (Signed)
Ok to charge late cancellation fee

## 2018-06-19 ENCOUNTER — Ambulatory Visit (HOSPITAL_COMMUNITY): Payer: Self-pay | Admitting: Psychiatry

## 2018-06-24 DIAGNOSIS — M21762 Unequal limb length (acquired), left tibia: Secondary | ICD-10-CM | POA: Diagnosis not present

## 2018-06-24 DIAGNOSIS — M25561 Pain in right knee: Secondary | ICD-10-CM | POA: Diagnosis not present

## 2018-06-24 DIAGNOSIS — G8929 Other chronic pain: Secondary | ICD-10-CM | POA: Diagnosis not present

## 2018-07-05 ENCOUNTER — Other Ambulatory Visit (HOSPITAL_COMMUNITY): Payer: Self-pay

## 2018-07-05 DIAGNOSIS — F410 Panic disorder [episodic paroxysmal anxiety] without agoraphobia: Secondary | ICD-10-CM

## 2018-07-05 MED ORDER — PAROXETINE HCL 30 MG PO TABS: 60.0000 mg | ORAL_TABLET | Freq: Every day | ORAL | 0 refills | Status: DC

## 2018-07-05 MED ORDER — CLONAZEPAM 0.5 MG PO TABS: ORAL_TABLET | ORAL | 0 refills | Status: DC

## 2018-07-20 ENCOUNTER — Ambulatory Visit (HOSPITAL_COMMUNITY): Payer: Self-pay | Admitting: Psychiatry

## 2018-07-20 ENCOUNTER — Telehealth (HOSPITAL_COMMUNITY): Payer: Self-pay | Admitting: Psychiatry

## 2018-07-20 NOTE — Telephone Encounter (Signed)
err

## 2018-07-31 ENCOUNTER — Ambulatory Visit (HOSPITAL_COMMUNITY): Payer: Self-pay | Admitting: Psychiatry

## 2018-08-05 DIAGNOSIS — Z23 Encounter for immunization: Secondary | ICD-10-CM | POA: Diagnosis not present

## 2018-08-10 DIAGNOSIS — J069 Acute upper respiratory infection, unspecified: Secondary | ICD-10-CM | POA: Diagnosis not present

## 2018-08-10 DIAGNOSIS — J029 Acute pharyngitis, unspecified: Secondary | ICD-10-CM | POA: Diagnosis not present

## 2018-08-10 DIAGNOSIS — R05 Cough: Secondary | ICD-10-CM | POA: Diagnosis not present

## 2018-08-10 DIAGNOSIS — Z888 Allergy status to other drugs, medicaments and biological substances status: Secondary | ICD-10-CM | POA: Diagnosis not present

## 2018-08-10 DIAGNOSIS — J028 Acute pharyngitis due to other specified organisms: Secondary | ICD-10-CM | POA: Diagnosis not present

## 2018-08-12 ENCOUNTER — Encounter (HOSPITAL_COMMUNITY): Payer: Self-pay

## 2018-08-12 ENCOUNTER — Ambulatory Visit (HOSPITAL_COMMUNITY)
Admission: EM | Admit: 2018-08-12 | Discharge: 2018-08-12 | Disposition: A | Discharge status: Home / Self Care | Payer: 59 | Attending: Physician Assistant | Admitting: Physician Assistant

## 2018-08-12 DIAGNOSIS — J040 Acute laryngitis: Secondary | ICD-10-CM

## 2018-08-12 DIAGNOSIS — J40 Bronchitis, not specified as acute or chronic: Secondary | ICD-10-CM | POA: Insufficient documentation

## 2018-08-12 DIAGNOSIS — H66003 Acute suppurative otitis media without spontaneous rupture of ear drum, bilateral: Secondary | ICD-10-CM | POA: Diagnosis not present

## 2018-08-12 MED ORDER — FLUTICASONE PROPIONATE 50 MCG/ACT NA SUSP: 2.0000 | Freq: Every day | NASAL | 0 refills | Status: DC

## 2018-08-12 MED ORDER — AMOXICILLIN 875 MG PO TABS: 875.0000 mg | ORAL_TABLET | Freq: Two times a day (BID) | ORAL | 0 refills | Status: DC

## 2018-08-12 MED ORDER — IPRATROPIUM BROMIDE 0.06 % NA SOLN: 2.0000 | Freq: Four times a day (QID) | NASAL | 0 refills | Status: DC

## 2018-08-12 MED ORDER — PREDNISONE 50 MG PO TABS: 50.0000 mg | ORAL_TABLET | Freq: Every day | ORAL | 0 refills | Status: DC

## 2018-08-12 NOTE — ED Triage Notes (Signed)
Pt present coughing, fever and left ear pain.  Symptoms started 3 days ago max.

## 2018-08-12 NOTE — ED Provider Notes (Signed)
[facility]    CSN: [id number] Arrival date & time: 08/12/18  0801     History   Chief Complaint Chief Complaint  Patient presents with  . Cough  . Fever  . Otalgia    HPI Brian [name] is a 27 y.o. male.   27 year old male comes in for 3-4 day history of URI symptoms. Has had cough, rhinorrhea, nasal congestion, sore throat, ear pain. States was on a flight yesterday, which made ear pain significantly worse. Has had chills and sweats without known fever. otc cold medicine without relief. Never smoker.      Past Medical History:  Diagnosis Date  . Anxiety   . Hypertension     Patient Active Problem List   Diagnosis Date Noted  . Panic attacks 10/03/2015  . Palpitations 10/03/2015  . Gynecomastia, male 10/03/2015  . Lumbar disc disease 11/01/2011    Past Surgical History:  Procedure Laterality Date  . ANTERIOR CRUCIATE LIGAMENT REPAIR    . BACK SURGERY         Home Medications    Prior to Admission medications   Medication Sig Start Date End Date Taking? Authorizing Provider  acetaminophen-codeine (TYLENOL #4) 300-60 MG tablet Take 1 tablet by mouth every 4 (four) hours as needed for moderate pain.    [provider]  amoxicillin (AMOXIL) 875 MG tablet Take 1 tablet (875 mg total) by mouth 2 (two) times daily. 08/12/18   Cathie HoopsYu, Kristoph Sattler V, PA-C  clonazePAM (KLONOPIN) 0.5 MG tablet Take 1-2 tab daily as needed for sleep and panic attck 07/05/18   Arfeen, Phillips GroutSyed T, MD  fluticasone (FLONASE) 50 MCG/ACT nasal spray Place 2 sprays into both nostrils daily. 08/12/18   Cathie HoopsYu, Vedder Brittian V, PA-C  ipratropium (ATROVENT) 0.06 % nasal spray Place 2 sprays into both nostrils 4 (four) times daily. 08/12/18   Cathie HoopsYu, Delbert Darley V, PA-C  omeprazole (PRILOSEC) 20 MG capsule Take 1 capsule (20 mg total) by mouth daily. 04/25/18   Napoleon FormNandigam, Kavitha V, MD  [name] tablet Take 2 tablets (60 mg total) by mouth daily. 07/05/18   Arfeen, Phillips GroutSyed T, MD  predniSONE (DELTASONE) 50  MG tablet Take 1 tablet (50 mg total) by mouth daily. 08/12/18   Belinda FisherYu, Hula Tasso V, PA-C    Family History Family History  Problem Relation Age of Onset  . DiGeorge syndrome Sister   . Colon cancer Paternal Grandfather     Social History Social History   Tobacco Use  . Smoking status: Never Smoker  . Smokeless tobacco: Current User    Types: Snuff  Substance Use Topics  . Alcohol use: Yes    Alcohol/week: 0.0 standard drinks    Comment: occ  . Drug use: No     Allergies   Reglan [metoclopramide]   Review of Systems Review of Systems  Reason unable to perform ROS: See HPI as above.     Physical Exam Triage Vital Signs ED Triage Vitals [08/12/18 0825]  Enc Vitals Group     BP (!) 149/96     Pulse Rate 92     Resp 18     Temp 98.2 F (36.8 C)     Temp Source Oral     SpO2 99 %     Weight      Height      Head Circumference      Peak Flow      Pain Score      Pain Loc  Pain Edu?      Excl. in GC?    No data found.  Updated Vital Signs BP (!) 149/96 (BP Location: Left Arm)   Pulse 92   Temp 98.2 F (36.8 C) (Oral)   Resp 18   SpO2 99%   Physical Exam Constitutional:      General: He is not in acute distress.    Appearance: He is well-developed. He is not ill-appearing, toxic-appearing or diaphoretic.  HENT:     Head: Normocephalic and atraumatic.     Right Ear: Ear canal and external ear normal. Tympanic membrane is erythematous and bulging.     Left Ear: Ear canal and external ear normal. Tympanic membrane is erythematous and bulging.     Nose: Nose normal.     Right Sinus: No maxillary sinus tenderness or frontal sinus tenderness.     Left Sinus: No maxillary sinus tenderness or frontal sinus tenderness.     Mouth/Throat:     Pharynx: Uvula midline.  Eyes:     Conjunctiva/sclera: Conjunctivae normal.     Pupils: Pupils are equal, round, and reactive to light.  Neck:     Musculoskeletal: Normal range of motion and neck supple.    Cardiovascular:     Rate and Rhythm: Normal rate and regular rhythm.     Heart sounds: Normal heart sounds. No murmur. No friction rub. No gallop.   Pulmonary:     Effort: Pulmonary effort is normal. No respiratory distress.     Breath sounds: Normal breath sounds. No stridor. No decreased breath sounds, wheezing, rhonchi or rales.  Lymphadenopathy:     Cervical: No cervical adenopathy.  Skin:    General: Skin is warm and dry.  Neurological:     Mental Status: He is alert and oriented to person, place, and time.  Psychiatric:        Behavior: Behavior normal.        Judgment: Judgment normal.      UC Treatments / Results  Labs (all labs ordered are listed, but only abnormal results are displayed) Labs Reviewed - No data to display  EKG None  Radiology No results found.  Procedures Procedures (including critical care time)  Medications Ordered in UC Medications - No data to display  Initial Impression / Assessment and Plan / UC Course  I have reviewed the triage vital signs and the nursing notes.  Pertinent labs & imaging results that were available during my care of the patient were reviewed by me and considered in my medical decision making (see chart for details).     Amoxicillin as directed for otitis media. Prednisone as directed. Other symptomatic treatment discussed. Push fluids. Return precautions given. Patient expresses understanding and agrees to plan.  Final Clinical Impressions(s) / UC Diagnoses   Final diagnoses:  Non-recurrent acute suppurative otitis media of both ears without spontaneous rupture of tympanic membranes  Bronchitis  Laryngitis    ED Prescriptions    Medication Sig Dispense Auth. Provider   amoxicillin (AMOXIL) 875 MG tablet Take 1 tablet (875 mg total) by mouth 2 (two) times daily. 14 tablet Jaelin Fackler V, PA-C   predniSONE (DELTASONE) 50 MG tablet Take 1 tablet (50 mg total) by mouth daily. 5 tablet Letoya Stallone V, PA-C   ipratropium  (ATROVENT) 0.06 % nasal spray Place 2 sprays into both nostrils 4 (four) times daily. 15 mL Jaileigh Weimer V, PA-C   fluticasone (FLONASE) 50 MCG/ACT nasal spray Place 2 sprays into both nostrils daily.  1 g Threasa Alpha, [place] 08/12/18 [phone]

## 2018-08-12 NOTE — Discharge Instructions (Addendum)
Start prednisone and amoxicillin as directed. Start flonase, atrovent nasal spray for nasal congestion/drainage. You can use over the counter nasal saline rinse such as neti pot for nasal congestion. Keep hydrated, your urine should be clear to pale yellow in color. Tylenol/motrin for fever and pain. Monitor for any worsening of symptoms, chest pain, shortness of breath, wheezing, swelling of the throat, follow up for reevaluation.   For sore throat/cough try using a honey-based tea. Use 3 teaspoons of honey with juice squeezed from half lemon. Place shaved pieces of ginger into 1/2-1 cup of water and warm over stove top. Then mix the ingredients and repeat every 4 hours as needed.

## 2018-08-16 ENCOUNTER — Other Ambulatory Visit: Payer: Self-pay

## 2018-08-16 ENCOUNTER — Encounter (HOSPITAL_COMMUNITY): Payer: Self-pay | Admitting: Emergency Medicine

## 2018-08-16 ENCOUNTER — Emergency Department (HOSPITAL_COMMUNITY)
Admission: EM | Admit: 2018-08-16 | Discharge: 2018-08-16 | Disposition: A | Discharge status: Home / Self Care | Payer: 59 | Attending: Emergency Medicine | Admitting: Emergency Medicine

## 2018-08-16 DIAGNOSIS — Z79899 Other long term (current) drug therapy: Secondary | ICD-10-CM | POA: Insufficient documentation

## 2018-08-16 DIAGNOSIS — S161XXA Strain of muscle, fascia and tendon at neck level, initial encounter: Secondary | ICD-10-CM

## 2018-08-16 DIAGNOSIS — Y999 Unspecified external cause status: Secondary | ICD-10-CM | POA: Diagnosis not present

## 2018-08-16 DIAGNOSIS — I10 Essential (primary) hypertension: Secondary | ICD-10-CM | POA: Insufficient documentation

## 2018-08-16 DIAGNOSIS — Y92410 Unspecified street and highway as the place of occurrence of the external cause: Secondary | ICD-10-CM | POA: Diagnosis not present

## 2018-08-16 DIAGNOSIS — S199XXA Unspecified injury of neck, initial encounter: Secondary | ICD-10-CM | POA: Diagnosis present

## 2018-08-16 DIAGNOSIS — Y939 Activity, unspecified: Secondary | ICD-10-CM | POA: Diagnosis not present

## 2018-08-16 MED ORDER — CYCLOBENZAPRINE HCL 10 MG PO TABS: 10.0000 mg | ORAL_TABLET | Freq: Two times a day (BID) | ORAL | 0 refills | Status: DC | PRN

## 2018-08-16 NOTE — Discharge Instructions (Signed)
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
## 2018-08-26 ENCOUNTER — Other Ambulatory Visit (HOSPITAL_COMMUNITY): Payer: Self-pay

## 2018-09-30 ENCOUNTER — Other Ambulatory Visit (HOSPITAL_COMMUNITY): Payer: Self-pay

## 2018-09-30 DIAGNOSIS — F410 Panic disorder [episodic paroxysmal anxiety] without agoraphobia: Secondary | ICD-10-CM

## 2018-09-30 MED ORDER — PAROXETINE HCL 30 MG PO TABS: 60.0000 mg | ORAL_TABLET | Freq: Every day | ORAL | 0 refills | Status: DC

## 2018-09-30 MED ORDER — CLONAZEPAM 0.5 MG PO TABS: ORAL_TABLET | ORAL | 0 refills | Status: DC

## 2018-10-03 ENCOUNTER — Encounter (HOSPITAL_COMMUNITY): Payer: Self-pay | Admitting: Psychiatry

## 2018-10-03 ENCOUNTER — Ambulatory Visit (INDEPENDENT_AMBULATORY_CARE_PROVIDER_SITE_OTHER): Payer: Self-pay | Admitting: Psychiatry

## 2018-10-03 VITALS — BP 148/78 | HR 71 | Ht 72.0 in | Wt 268.0 lb

## 2018-10-03 DIAGNOSIS — F411 Generalized anxiety disorder: Secondary | ICD-10-CM

## 2018-10-03 DIAGNOSIS — F410 Panic disorder [episodic paroxysmal anxiety] without agoraphobia: Secondary | ICD-10-CM

## 2018-10-03 DIAGNOSIS — F4310 Post-traumatic stress disorder, unspecified: Secondary | ICD-10-CM

## 2018-10-03 MED ORDER — DULOXETINE HCL 20 MG PO CPEP: ORAL_CAPSULE | ORAL | 1 refills | Status: DC

## 2018-10-03 MED ORDER — CLONAZEPAM 0.5 MG PO TABS: ORAL_TABLET | ORAL | 0 refills | Status: DC

## 2018-10-03 NOTE — Patient Instructions (Signed)
Reduced Paxil to 30 mg for 10 days and than every other day for another week. Start Cymbalta 20 mg daily for one week and than twice daily.
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
## 2018-10-14 ENCOUNTER — Ambulatory Visit (HOSPITAL_COMMUNITY): Payer: Self-pay | Admitting: Psychiatry

## 2018-11-12 ENCOUNTER — Ambulatory Visit (INDEPENDENT_AMBULATORY_CARE_PROVIDER_SITE_OTHER): Payer: Self-pay | Admitting: Psychiatry

## 2018-11-12 ENCOUNTER — Other Ambulatory Visit: Payer: Self-pay

## 2018-11-12 DIAGNOSIS — F410 Panic disorder [episodic paroxysmal anxiety] without agoraphobia: Secondary | ICD-10-CM

## 2018-11-12 DIAGNOSIS — F411 Generalized anxiety disorder: Secondary | ICD-10-CM

## 2018-11-12 DIAGNOSIS — F4310 Post-traumatic stress disorder, unspecified: Secondary | ICD-10-CM

## 2018-11-12 MED ORDER — CLONAZEPAM 0.5 MG PO TABS: ORAL_TABLET | ORAL | 0 refills | Status: DC

## 2018-11-12 MED ORDER — DULOXETINE HCL 20 MG PO CPEP: ORAL_CAPSULE | ORAL | 1 refills | Status: DC

## 2018-11-12 NOTE — Progress Notes (Signed)
Virtual Visit via Telephone Note  I connected with Brian [name] on 11/12/18 at 10:40 AM EDT by telephone and verified that I am speaking with the correct person using two identifiers.   I discussed the limitations, risks, security and privacy concerns of performing an evaluation and management service by telephone and the availability of in person appointments. I also discussed with the patient that there may be a patient responsible charge related to this service. The patient expressed understanding and agreed to proceed.   History of Present Illness: Patient was evaluated through phone session.  He had not picked up his Cymbalta because he does not have insurance.  Now he had insurance so he is hoping to pick up the medicine next week.  He is struggling with anxiety, nightmares and flashback.  He is taking Klonopin which is helping his panic attacks but he still had lack of sleep.  There are nights when he sleeps 6 hours but there are nights when he does not sleep more than 4 hours.  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  He likes his new job.  It is not very physically doing.  He is anxious about pandemic coronavirus.  He is keeping social distancing and avoid going outside unnecessary.  He is no longer taking Paxil which he weaned himself off hoping to start Cymbalta.  He admitted since not taking Paxil his anxiety is worse but he is going to pick up the Cymbalta in few days.  I recommended if he has any question then he should call us.  We again discussed medication side effect specially Cymbalta can help pain anxiety and depression.  He is not drinking.  Past Psychiatric History: Reviewed. H/O panic attacks.  Given valium by Dr Corky Sox. Saw therapist at [facility] mental health. [facility] We tried Trazodone but did not  helped. No h/o inpatient treatment, psychosis, suicidal attempt, abuse, paranoia or self abusive behavior.   Mental status examination: Limited mental stat examination done on the phone.  Patient describes his mood anxious but relevant.  He denies any auditory or visual hallucination.  He denies any active or passive suicidal thoughts or homicidal thought.  He reported no delusions, paranoia.  He does not appear to be distracted on the phone.  He is alert and oriented x3.  His fund of knowledge is adequate.  He reported no side effects.  His cognition is intact.  His insight and judgment is okay.    Assessment and Plan: Panic disorder, generalized anxiety disorder, PTSD.  Patient has not started his Cymbalta but hoping to start in few days.  He is no longer taking Paxil.  He is only taking Klonopin which helps his panic attacks and anxiety.  He is hoping to start therapy once pandemic coronavirus subsided.  I recommended to call us back if the new medicine cause any issues or side effects.  He understand.  Discussed safety concern that anytime having active suicidal thoughts or homicidal her that he need to call 911 or go to local emergency room.  Follow-up in 2 months.  Follow Up Instructions:    I discussed the assessment and treatment plan with the patient. The patient was provided an opportunity to ask questions and all were answered. The patient agreed with the plan and demonstrated an understanding of  the instructions.   The patient was advised to call back or seek an in-person evaluation if the symptoms worsen or if the condition fails to improve as anticipated.  I provided 15 minutes of non-face-to-face time during this encounter.   Kathlee Nations, MD

## 2018-11-19 DIAGNOSIS — M25561 Pain in right knee: Secondary | ICD-10-CM | POA: Diagnosis not present

## 2018-11-19 DIAGNOSIS — I10 Essential (primary) hypertension: Secondary | ICD-10-CM | POA: Diagnosis not present

## 2018-12-10 DIAGNOSIS — R05 Cough: Secondary | ICD-10-CM | POA: Diagnosis not present

## 2018-12-10 DIAGNOSIS — Z20828 Contact with and (suspected) exposure to other viral communicable diseases: Secondary | ICD-10-CM | POA: Diagnosis not present

## 2019-01-09 ENCOUNTER — Other Ambulatory Visit: Payer: Self-pay

## 2019-01-09 ENCOUNTER — Ambulatory Visit (INDEPENDENT_AMBULATORY_CARE_PROVIDER_SITE_OTHER): Payer: Self-pay | Admitting: Psychiatry

## 2019-01-09 ENCOUNTER — Encounter (HOSPITAL_COMMUNITY): Payer: Self-pay | Admitting: Psychiatry

## 2019-01-09 DIAGNOSIS — F411 Generalized anxiety disorder: Secondary | ICD-10-CM

## 2019-01-09 DIAGNOSIS — F4310 Post-traumatic stress disorder, unspecified: Secondary | ICD-10-CM

## 2019-01-09 DIAGNOSIS — F410 Panic disorder [episodic paroxysmal anxiety] without agoraphobia: Secondary | ICD-10-CM

## 2019-01-09 MED ORDER — DULOXETINE HCL 40 MG PO CPEP: 40.0000 mg | ORAL_CAPSULE | Freq: Every day | ORAL | 1 refills | Status: DC

## 2019-01-09 MED ORDER — CLONAZEPAM 0.5 MG PO TABS: ORAL_TABLET | ORAL | 1 refills | Status: DC

## 2019-01-09 NOTE — Progress Notes (Signed)
Virtual Visit via Telephone Note  I connected with Brian [name] on 01/09/19 at  9:40 AM EDT by telephone and verified that I am speaking with the correct person using two identifiers.   I discussed the limitations, risks, security and privacy concerns of performing an evaluation and management service by telephone and the availability of in person appointments. I also discussed with the patient that there may be a patient responsible charge related to this service. The patient expressed understanding and agreed to proceed.   History of Present Illness: Patient was evaluated by phone session.  He was last evaluated 2 months ago at that time he was having a lot of anxiety.  We have recommended to try Cymbalta but due to lack of insurance he could not picked up.  Now he is taking Cymbalta 20 mg but forgot to take second dose.  He feels some improvement as he is less anxious but still struggle with anxiety sometimes and nightmares and flashback.  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  He is no longer taking Paxil.  His energy level is better.  He denies any crying spells or any feeling of hopelessness or worthlessness.  His appetite is okay.  He denies drinking or using any illegal substances.  He reported no tremors, shakes or any EPS.  He takes Klonopin most of the night and some time second dose if he cannot sleep better.  Past Psychiatric History:Reviewed. H/Opanic attacks. Given valium byDr Corky Sox.Saw therapist at [facility] mental health. [facility] We tried Trazodone but did not helped. No h/oinpatient treatment, psychosis, suicidal attempt,abuse,paranoiaorself abusive behavior.   Psychiatric Specialty Exam: Physical Exam  ROS  There were no vitals taken for this visit.There is no height or weight on file to calculate BMI.  General Appearance: NA  Eye Contact:   NA  Speech:  Clear and Coherent  Volume:  Normal  Mood:  Anxious  Affect:  NA  Thought Process:  Goal Directed  Orientation:  Full (Time, Place, and Person)  Thought Content:  WDL  Suicidal Thoughts:  No  Homicidal Thoughts:  No  Memory:  Immediate;   Good  Judgement:  Good  Insight:  Good  Psychomotor Activity:  NA  Concentration:  Concentration: Good and Attention Span: Good  Recall:  Good  Fund of Knowledge:  Good  Language:  Good  Akathisia:  NA  Handed:  Right  AIMS (if indicated):     Assets:  Communication Skills Desire for Improvement Housing Resilience Social Support Talents/Skills  ADL's:  Intact  Cognition:  WNL  Sleep:   fair      Assessment and Plan: Panic disorder, generalized anxiety disorder, PTSD.  Patient started showing some improvement with Cymbalta but forgot to take second dose after 1 week.  So far he is tolerating very well and reported no tremors shakes or any EPS.  He has taken few times clonazepam for severe anxiety and sleep.  He has fewer panic attacks.  Discussed medication side effects and recommend to take the medicine as prescribed.  Recommend to take Cymbalta 40 mg and clonazepam 0.5 mg as needed for anxiety and panic attacks.  Discussed medication side effects and benefits.  Recommended to call us back if is any question or any concern.  Follow-up in 2 months.  Follow Up Instructions:    I discussed the assessment and treatment plan with  the patient. The patient was provided an opportunity to ask questions and all were answered. The patient agreed with the plan and demonstrated an understanding of the instructions.   The patient was advised to call back or seek an in-person evaluation if the symptoms worsen or if the condition fails to improve as anticipated.  I provided 15 minutes of non-face-to-face time during this encounter.   Kathlee Nations, MD

## 2019-01-21 DIAGNOSIS — M545 Low back pain: Secondary | ICD-10-CM | POA: Diagnosis not present

## 2019-01-22 ENCOUNTER — Other Ambulatory Visit: Payer: Self-pay | Admitting: Sports Medicine

## 2019-01-22 DIAGNOSIS — M5440 Lumbago with sciatica, unspecified side: Secondary | ICD-10-CM

## 2019-02-11 ENCOUNTER — Other Ambulatory Visit: Payer: 59

## 2019-03-05 DIAGNOSIS — M5416 Radiculopathy, lumbar region: Secondary | ICD-10-CM | POA: Diagnosis not present

## 2019-03-08 ENCOUNTER — Other Ambulatory Visit: Payer: 59

## 2019-03-11 ENCOUNTER — Other Ambulatory Visit: Payer: Self-pay

## 2019-03-11 ENCOUNTER — Ambulatory Visit (HOSPITAL_COMMUNITY): Payer: Self-pay | Admitting: Psychiatry

## 2019-05-28 ENCOUNTER — Other Ambulatory Visit (HOSPITAL_COMMUNITY): Payer: Self-pay | Admitting: Psychiatry

## 2019-05-28 DIAGNOSIS — F410 Panic disorder [episodic paroxysmal anxiety] without agoraphobia: Secondary | ICD-10-CM

## 2019-06-12 ENCOUNTER — Other Ambulatory Visit (HOSPITAL_COMMUNITY): Payer: Self-pay

## 2019-06-12 DIAGNOSIS — F410 Panic disorder [episodic paroxysmal anxiety] without agoraphobia: Secondary | ICD-10-CM

## 2019-06-12 MED ORDER — CLONAZEPAM 0.5 MG PO TABS: ORAL_TABLET | ORAL | 0 refills | Status: DC

## 2019-06-16 ENCOUNTER — Other Ambulatory Visit: Payer: Self-pay

## 2019-06-16 ENCOUNTER — Encounter (HOSPITAL_COMMUNITY): Payer: Self-pay | Admitting: Psychiatry

## 2019-06-16 ENCOUNTER — Ambulatory Visit (INDEPENDENT_AMBULATORY_CARE_PROVIDER_SITE_OTHER): Payer: Self-pay | Admitting: Psychiatry

## 2019-06-16 DIAGNOSIS — F4310 Post-traumatic stress disorder, unspecified: Secondary | ICD-10-CM

## 2019-06-16 DIAGNOSIS — F410 Panic disorder [episodic paroxysmal anxiety] without agoraphobia: Secondary | ICD-10-CM

## 2019-06-16 DIAGNOSIS — F411 Generalized anxiety disorder: Secondary | ICD-10-CM

## 2019-06-16 MED ORDER — CLONAZEPAM 0.5 MG PO TABS: 0.7500 mg | ORAL_TABLET | Freq: Every day | ORAL | 1 refills | Status: DC

## 2019-06-16 MED ORDER — DULOXETINE HCL 40 MG PO CPEP: 40.0000 mg | ORAL_CAPSULE | Freq: Every day | ORAL | 1 refills | Status: DC

## 2019-06-16 NOTE — Progress Notes (Signed)
Virtual Visit via Telephone Note  I connected with Brian [name] on 06/16/19 at  8:40 AM EST by telephone and verified that I am speaking with the correct person using two identifiers.   I discussed the limitations, risks, security and privacy concerns of performing an evaluation and management service by telephone and the availability of in person appointments. I also discussed with the patient that there may be a patient responsible charge related to this service. The patient expressed understanding and agreed to proceed.   History of Present Illness: Patient was evaluated by phone session.  He admitted missing appointment and out of his Cymbalta for past few weeks.  He admitted increased anxiety, nervousness and poor sleep.  He tried taking [name] 2 tablet but it make him very sluggish and tired next day.  He reported 1 tablet of 0.5 does not work and he had tried taking 1-1/2 that works better than just 1 tablet.  He reported no side effects from Cymbalta.  He admitted once increase Cymbalta to 40 mg his anxiety was much better.  He is very busy at his job.  He works on Armed forces logistics/support/administrative officer.  Denies any crying spells or any feeling of hopelessness or worthlessness.  He admitted lately having nightmares and flashback but they are not as intense.  He denies drinking or using any illegal substances.  He is no longer taking steroids for his pain and he has lost few pounds since the last visit.  Like to resume his medication.   Past Psychiatric History:Reviewed. H/Opanic attacks. Given valium byDr Estill Batten.Saw therapist at [facility] mental health. TookAmbien, Xanax and Paxil from urgent [facility] in 2017. We tried Trazodone but did not helped. No h/oinpatient treatment, psychosis, suicidal attempt,abuse,paranoiaorself abusive behavior.    Psychiatric Specialty Exam: Physical Exam  ROS  There were no vitals taken for this visit.There is no height or  weight on file to calculate BMI.  General Appearance: NA  Eye Contact:  NA  Speech:  Clear and Coherent  Volume:  Normal  Mood:  Dysphoric  Affect:  NA  Thought Process:  Goal Directed  Orientation:  Full (Time, Place, and Person)  Thought Content:  Rumination  Suicidal Thoughts:  No  Homicidal Thoughts:  No  Memory:  Immediate;   Good Recent;   Good Remote;   Good  Judgement:  Fair  Insight:  Good  Psychomotor Activity:  NA  Concentration:  Concentration: Good and Attention Span: Good  Recall:  Good  Fund of Knowledge:  Good  Language:  Good  Akathisia:  No  Handed:  Right  AIMS (if indicated):     Assets:  Communication Skills Desire for Improvement Housing Resilience  ADL's:  Intact  Cognition:  WNL  Sleep:   poor      Assessment and Plan: Panic disorder.  Generalized anxiety disorder.  PTSD.  Discussed risk of noncompliance to medication.  Encouraged to try taking Cymbalta 40 mg every day to prevent relapses.  He agreed with the plan.  He is not interested in therapy.  We will adjust his Klonopin to 0.75 mg to take every night as it does help his sleep and nightmares.  Discussed benzodiazepine dependence, tolerance and withdrawal.  Recommended to call us back if he has any question or any concern.  Follow-up in 2 months.  Follow Up Instructions:    I discussed the assessment and treatment plan with the patient. The patient was provided an opportunity  to ask questions and all were answered. The patient agreed with the plan and demonstrated an understanding of the instructions.   The patient was advised to call back or seek an in-person evaluation if the symptoms worsen or if the condition fails to improve as anticipated.  I provided 20 minutes of non-face-to-face time during this encounter.   Kathlee Nations, MD

## 2019-08-20 ENCOUNTER — Encounter (HOSPITAL_COMMUNITY): Payer: Self-pay | Admitting: Psychiatry

## 2019-08-20 ENCOUNTER — Other Ambulatory Visit: Payer: Self-pay

## 2019-08-20 ENCOUNTER — Ambulatory Visit (INDEPENDENT_AMBULATORY_CARE_PROVIDER_SITE_OTHER): Payer: Self-pay | Admitting: Psychiatry

## 2019-08-20 DIAGNOSIS — F4310 Post-traumatic stress disorder, unspecified: Secondary | ICD-10-CM

## 2019-08-20 DIAGNOSIS — F410 Panic disorder [episodic paroxysmal anxiety] without agoraphobia: Secondary | ICD-10-CM

## 2019-08-20 DIAGNOSIS — F411 Generalized anxiety disorder: Secondary | ICD-10-CM

## 2019-08-20 MED ORDER — DULOXETINE HCL 40 MG PO CPEP: 40.0000 mg | ORAL_CAPSULE | Freq: Every day | ORAL | 2 refills | Status: DC

## 2019-08-20 MED ORDER — CLONAZEPAM 0.5 MG PO TABS: ORAL_TABLET | ORAL | 1 refills | Status: DC

## 2019-08-20 NOTE — Progress Notes (Signed)
Virtual Visit via Telephone Note  I connected with Brian [name] on 08/20/19 at 10:40 AM EST by telephone and verified that I am speaking with the correct person using two identifiers.   I discussed the limitations, risks, security and privacy concerns of performing an evaluation and management service by telephone and the availability of in person appointments. I also discussed with the patient that there may be a patient responsible charge related to this service. The patient expressed understanding and agreed to proceed.   History of Present Illness: Patient was evaluated by phone session.  He is doing well on his current medication.  He noticed much improvement with Klonopin and Cymbalta combination.  He had a good Christmas.  He was able to see his kids and visit his mother and grandmother who lives in [place].  His kids are in [place] and usually he is seeing him every 3 months.  He is tolerating his medication very well.  He has no tremors shakes or any EPS.  He is very busy on his work.  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  He is very happy because he can make his own schedule.  He admitted since the last visit he has 1 major panic attack when he was watching a movie about firefighter and there are some sceen which already troubling and he had to take the [place].  Overall he feels that his flashbacks and nightmares are not as bad and he is able to sleep well.  He denies drinking or using any illegal substances.  Energy level is good.   Past Psychiatric History:Reviewed. H/Opanic attacks. Given valium byDr Estill Batten.Saw therapist at [facility] mental health. TookAmbien, Xanax and Paxil from urgent [facility] in 2017. We tried Trazodone but did not helped. No h/oinpatient treatment, psychosis, suicidal attempt,abuse,paranoiaorself abusive behavior.    Psychiatric Specialty Exam: Physical Exam  Review of Systems  There were no  vitals taken for this visit.There is no height or weight on file to calculate BMI.  General Appearance: NA  Eye Contact:  NA  Speech:  Clear and Coherent  Volume:  Normal  Mood:  Euthymic  Affect:  NA  Thought Process:  Goal Directed  Orientation:  Full (Time, Place, and Person)  Thought Content:  WDL  Suicidal Thoughts:  No  Homicidal Thoughts:  No  Memory:  Immediate;   Good Recent;   Good Remote;   Good  Judgement:  Good  Insight:  Good  Psychomotor Activity:  NA  Concentration:  Concentration: Good and Attention Span: Good  Recall:  Good  Fund of Knowledge:  Good  Language:  Good  Akathisia:  No  Handed:  Right  AIMS (if indicated):     Assets:  Communication Skills Desire for Improvement Housing Resilience Social Support  ADL's:  Intact  Cognition:  WNL  Sleep:   good      Assessment and Plan: Posttraumatic stress disorder.  Anxiety.  Panic disorder.  Patient doing well on his medication which was resumed 2 months ago.  Patient like to continue since it is working very well.  Continue [name] at bedtime and second if needed for panic attacks.  Continue Cymbalta 40 mg daily.  Patient is not interested in therapy.  Recommended to call us back if is any question of any concern.  Follow-up in 3 months.  Follow Up Instructions:    I discussed the assessment and treatment plan with the patient.  The patient was provided an opportunity to ask questions and all were answered. The patient agreed with the plan and demonstrated an understanding of the instructions.   The patient was advised to call back or seek an in-person evaluation if the symptoms worsen or if the condition fails to improve as anticipated.  I provided 15 minutes of non-face-to-face time during this encounter.   Cleotis Nipper, MD

## 2019-09-24 ENCOUNTER — Other Ambulatory Visit (HOSPITAL_COMMUNITY): Payer: Self-pay | Admitting: Family Medicine

## 2019-09-24 DIAGNOSIS — R0902 Hypoxemia: Secondary | ICD-10-CM

## 2019-09-30 ENCOUNTER — Other Ambulatory Visit: Payer: Self-pay

## 2019-09-30 ENCOUNTER — Ambulatory Visit (HOSPITAL_COMMUNITY)
Admission: RE | Admit: 2019-09-30 | Discharge: 2019-09-30 | Disposition: A | Discharge status: Home / Self Care | Payer: Medicaid Other | Source: Ambulatory Visit | Attending: Family Medicine | Admitting: Family Medicine

## 2019-09-30 DIAGNOSIS — R0902 Hypoxemia: Secondary | ICD-10-CM | POA: Diagnosis present

## 2019-09-30 NOTE — Progress Notes (Signed)
  Echocardiogram 2D Echocardiogram has been performed.  Brian [name] 09/30/2019, 4:03 PM

## 2019-11-18 ENCOUNTER — Other Ambulatory Visit: Payer: Self-pay

## 2019-11-18 ENCOUNTER — Ambulatory Visit (INDEPENDENT_AMBULATORY_CARE_PROVIDER_SITE_OTHER): Payer: Medicaid Other | Admitting: Psychiatry

## 2019-11-18 ENCOUNTER — Encounter (HOSPITAL_COMMUNITY): Payer: Self-pay | Admitting: Psychiatry

## 2019-11-18 DIAGNOSIS — F4310 Post-traumatic stress disorder, unspecified: Secondary | ICD-10-CM | POA: Diagnosis not present

## 2019-11-18 DIAGNOSIS — F410 Panic disorder [episodic paroxysmal anxiety] without agoraphobia: Secondary | ICD-10-CM | POA: Diagnosis not present

## 2019-11-18 DIAGNOSIS — F411 Generalized anxiety disorder: Secondary | ICD-10-CM | POA: Diagnosis not present

## 2019-11-18 MED ORDER — CLONAZEPAM 0.5 MG PO TABS: ORAL_TABLET | ORAL | 1 refills | Status: DC

## 2019-11-18 MED ORDER — DULOXETINE HCL 40 MG PO CPEP: 40.0000 mg | ORAL_CAPSULE | Freq: Every day | ORAL | 2 refills | Status: DC

## 2019-11-18 NOTE — Progress Notes (Signed)
Virtual Visit via Telephone Note  I connected with Brian [name] on 11/18/19 at  1:00 PM EDT by telephone and verified that I am speaking with the correct person using two identifiers.   I discussed the limitations, risks, security and privacy concerns of performing an evaluation and management service by telephone and the availability of in person appointments. I also discussed with the patient that there may be a patient responsible charge related to this service. The patient expressed understanding and agreed to proceed.   History of Present Illness: Patient was evaluated by phone session.  He is taking his medication as prescribed however he reported there a few times he had mild panic attacks and feels very anxious.  Though he did not feel the symptoms are very intense but like to consider therapy.  His job is going well.  He has his own business for heating and cooling.  Jon Gills is scheduled.  He is visiting his mother and grandmother who lives in [place].  His kids lives in [place] and he usually see them every few months.  He has no tremors, shakes or any EPS.  He try to avoid watching any movies that causes trigger for his PTSD.  His appetite is okay.  Energy level is good.    Past Psychiatric History:Reviewed. H/Opanic attacks. Given valium byDr Corky Sox.Saw therapist at [facility] mental health. [facility] We tried Trazodone but did not helped. No h/oinpatient treatment, psychosis, suicidal attempt,abuse,paranoiaorself abusive behavior.    Psychiatric Specialty Exam: Physical Exam  Review of Systems  There were no vitals taken for this visit.There is no height or weight on file to calculate BMI.  General Appearance: NA  Eye Contact:  NA  Speech:  Clear and Coherent and Slow  Volume:  Normal  Mood:  Anxious  Affect:  NA  Thought Process:  Goal Directed  Orientation:  Full (Time, Place, and Person)   Thought Content:  WDL  Suicidal Thoughts:  No  Homicidal Thoughts:  No  Memory:  Immediate;   Good Recent;   Good Remote;   Good  Judgement:  Good  Insight:  Good  Psychomotor Activity:  NA  Concentration:  Concentration: Good and Attention Span: Good  Recall:  Good  Fund of Knowledge:  Good  Language:  Good  Akathisia:  No  Handed:  Right  AIMS (if indicated):     Assets:  Communication Skills Desire for Improvement Housing Social Support Talents/Skills Transportation  ADL's:  Intact  Cognition:  WNL  Sleep:   good sometimes dream      Assessment and Plan: PTSD.  Anxiety.  Panic attacks.  Patient is a stable on his medication however continue to have on and off anxiety and panic attack.  He like to restart therapy.  We will refer him for therapy.  He does not want to change medication.  Continue [name] at bedtime and second if needed, continue Cymbalta 40 mg daily.  Discussed medication side effects and benefits.  Recommended to call us back if is any question or any concern.  Follow-up in 3 months.  Follow Up Instructions:    I discussed the assessment and treatment plan with the patient. The patient was provided an opportunity to ask questions and all were answered. The patient agreed with the plan and demonstrated an understanding of the instructions.   The patient was advised to call back or seek an in-person evaluation if the  symptoms worsen or if the condition fails to improve as anticipated.  I provided 20 minutes of non-face-to-face time during this encounter.   Kathlee Nations, MD

## 2019-11-24 ENCOUNTER — Telehealth (HOSPITAL_COMMUNITY): Payer: Self-pay

## 2019-11-24 NOTE — Telephone Encounter (Signed)
Prior authorization sent for Duloxetine capsules through [employer]. PA#: [id number]

## 2019-12-09 ENCOUNTER — Telehealth (HOSPITAL_COMMUNITY): Payer: Self-pay

## 2019-12-09 DIAGNOSIS — F4310 Post-traumatic stress disorder, unspecified: Secondary | ICD-10-CM

## 2019-12-09 DIAGNOSIS — F410 Panic disorder [episodic paroxysmal anxiety] without agoraphobia: Secondary | ICD-10-CM

## 2019-12-09 DIAGNOSIS — F411 Generalized anxiety disorder: Secondary | ICD-10-CM

## 2019-12-09 NOTE — Telephone Encounter (Signed)
Called pharmacy and gave verbal order per MD to change to 20mg  two a day. Called pt and made aware of change and directions for use. Pt also requested clonopin tablet refill. Pt was made aware that refill was available on previous rx and should be able to be picked up at pharmacy. Pt verbalized understanding and states he will call back with any issues.

## 2019-12-09 NOTE — Telephone Encounter (Signed)
Please call and change to 20 mg twice a day.

## 2019-12-09 NOTE — Telephone Encounter (Signed)
Per [employer], prior authorization denied for Duloxetine 40mg . Suggested, if appropriate, pt be prescribed Duloxetine 20mg  and take two daily instead as that would not require prior authorization.

## 2019-12-11 ENCOUNTER — Other Ambulatory Visit (HOSPITAL_COMMUNITY): Payer: Self-pay

## 2020-02-18 ENCOUNTER — Encounter (HOSPITAL_COMMUNITY): Payer: Self-pay | Admitting: Psychiatry

## 2020-02-18 ENCOUNTER — Telehealth (INDEPENDENT_AMBULATORY_CARE_PROVIDER_SITE_OTHER): Payer: Medicaid Other | Admitting: Psychiatry

## 2020-02-18 ENCOUNTER — Other Ambulatory Visit: Payer: Self-pay

## 2020-02-18 VITALS — Wt 240.0 lb

## 2020-02-18 DIAGNOSIS — F411 Generalized anxiety disorder: Secondary | ICD-10-CM | POA: Diagnosis not present

## 2020-02-18 DIAGNOSIS — F410 Panic disorder [episodic paroxysmal anxiety] without agoraphobia: Secondary | ICD-10-CM | POA: Diagnosis not present

## 2020-02-18 DIAGNOSIS — F4310 Post-traumatic stress disorder, unspecified: Secondary | ICD-10-CM

## 2020-02-18 MED ORDER — CLONAZEPAM 0.5 MG PO TABS: ORAL_TABLET | ORAL | 1 refills | Status: DC

## 2020-02-18 MED ORDER — DULOXETINE HCL 30 MG PO CPEP: 30.0000 mg | ORAL_CAPSULE | Freq: Two times a day (BID) | ORAL | 2 refills | Status: DC

## 2020-02-18 NOTE — Progress Notes (Signed)
Virtual Visit via Video Note  I connected with Brian [name] on 02/18/20 at  1:00 PM EDT by a video enabled telemedicine application and verified that I am speaking with the correct person using two identifiers.  Location: Patient: home Provider: home office   I discussed the limitations of evaluation and management by telemedicine and the availability of in person appointments. The patient expressed understanding and agreed to proceed.  History of Present Illness: Patient is evaluated by phone session.  He endorsed lately more anxious and nervous and obsess about things.  He also endorsed some time for sleep and racing thoughts.  Though he did not specify what triggered his anxiety but reported his two sons are visiting from [place] and living with him.  Patient told his boys are going to stay during the summer and he is happy about it but he feel more anxious because he has to be responsible for them.  He is also very busy at his work.  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  Though he denies any major panic attack but he feels anxious and nervous.  He takes Klonopin most of the nights but there are nights when he has racing thoughts.  He has no tremors shakes or any EPS.  He has sometimes dreams but denies any recent nightmares flashback.  He lost weight since he is more focused on his health and started drinking enough water, working and walking.  Denies drinking or using any illegal substances.  Past Psychiatric History:Reviewed. H/Opanic attacks. Given valium byDr Corky Sox.Saw therapist at [facility] mental health. [facility] We tried Trazodone but did not helped. No h/oinpatient treatment, psychosis, suicidal attempt,abuse,paranoiaorself abusive behavior.   Psychiatric Specialty Exam: Physical Exam  Review of Systems  Weight 240 lb (108.9 kg).There is no height or  weight on file to calculate BMI.  General Appearance: NA  Eye Contact:  NA  Speech:  Slow  Volume:  Normal  Mood:  Anxious and Dysphoric  Affect:  NA  Thought Process:  Goal Directed  Orientation:  Full (Time, Place, and Person)  Thought Content:  Rumination  Suicidal Thoughts:  No  Homicidal Thoughts:  No  Memory:  Immediate;   Good Recent;   Good Remote;   Good  Judgement:  Good  Insight:  Present  Psychomotor Activity:  NA  Concentration:  Concentration: Fair and Attention Span: Fair  Recall:  Good  Fund of Knowledge:  Good  Language:  Good  Akathisia:  No  Handed:  Right  AIMS (if indicated):     Assets:  Communication Skills Desire for Improvement Housing Resilience Talents/Skills Transportation  ADL's:  Intact  Cognition:  WNL  Sleep:   fair      Assessment and Plan: PTSD.  Anxiety.  Panic attacks.  Discuss his current symptoms.  We have offered him therapy but he did not schedule appointment.  Now he is willing to get therapy sessions.  I also recommend he can try Cymbalta 60 mg since he is tolerating the medication and reported no side effects.  We will continue [name] at bedtime to help his anxiety.  Discussed medication side effects and benefits.  I recommend to call us back if symptoms continue to get worse or if he has any question.  Follow-up in 3 months.      Follow Up Instructions:    I discussed  the assessment and treatment plan with the patient. The patient was provided an opportunity to ask questions and all were answered. The patient agreed with the plan and demonstrated an understanding of the instructions.   The patient was advised to call back or seek an in-person evaluation if the symptoms worsen or if the condition fails to improve as anticipated.  I provided 15 minutes of non-face-to-face time during this encounter.   Cleotis Nipper, MD

## 2020-05-20 ENCOUNTER — Other Ambulatory Visit: Payer: Self-pay

## 2020-05-20 ENCOUNTER — Telehealth (INDEPENDENT_AMBULATORY_CARE_PROVIDER_SITE_OTHER): Payer: Medicaid Other | Admitting: Psychiatry

## 2020-05-20 ENCOUNTER — Encounter (HOSPITAL_COMMUNITY): Payer: Self-pay | Admitting: Psychiatry

## 2020-05-20 VITALS — Wt 250.0 lb

## 2020-05-20 DIAGNOSIS — F410 Panic disorder [episodic paroxysmal anxiety] without agoraphobia: Secondary | ICD-10-CM

## 2020-05-20 DIAGNOSIS — F4310 Post-traumatic stress disorder, unspecified: Secondary | ICD-10-CM | POA: Diagnosis not present

## 2020-05-20 MED ORDER — CLONAZEPAM 0.5 MG PO TABS: ORAL_TABLET | ORAL | 1 refills | Status: DC

## 2020-05-20 MED ORDER — DULOXETINE HCL 60 MG PO CPEP: 60.0000 mg | ORAL_CAPSULE | Freq: Two times a day (BID) | ORAL | 2 refills | Status: DC

## 2020-05-20 NOTE — Progress Notes (Signed)
Virtual Visit via Telephone Note  I connected with Brian [name] on 05/20/20 at  1:00 PM EDT by telephone and verified that I am speaking with the correct person using two identifiers.  Location: Patient: home Provider: home office   I discussed the limitations, risks, security and privacy concerns of performing an evaluation and management service by telephone and the availability of in person appointments. I also discussed with the patient that there may be a patient responsible charge related to this service. The patient expressed understanding and agreed to proceed.   History of Present Illness: Patient is evaluated by phone session.  He has been very busy in past few months.  He has been traveling to multiple states.  He reported his work has been expanded and he does keep himself busy.  He has business of heating and cooling.  We have increased his Cymbalta to 60 mg on the last visit and he noticed much improvement in his anxiety, nervousness.  He has a 1 panic attack when he was on the flight and he noticed an obese person was sitting to him and he was very anxious.  However otherwise he has been doing good.  He had a good summer with his boys who stays with him.  He is going next week to visit them.  He occasionally had dreams and nightmares but his sleep is overall better.  He is taking antihypertensive medication regularly prescribed by his PCP Dr. Ocie Bob.  He denies any tremors, shakes or any EPS.  He denies any crying spells.  He is trying to lose weight.  He admitted in past few months he had gained a lot of weight but back to 250 pounds.  Like to lose more weight.  Past Psychiatric History:Reviewed. H/Opanic attacks. Given valium byDr Corky Sox.Saw therapist [facility]. [facility] We tried Trazodone but did not helped. No h/oinpatient treatment, psychosis, suicidal attempt,abuse,paranoiaorself  abusive behavior.   Psychiatric Specialty Exam: Physical Exam  Review of Systems  Weight 250 lb (113.4 kg).There is no height or weight on file to calculate BMI.  General Appearance: NA  Eye Contact:  NA  Speech:  Normal Rate  Volume:  Normal  Mood:  Euthymic  Affect:  NA  Thought Process:  Goal Directed  Orientation:  Full (Time, Place, and Person)  Thought Content:  WDL  Suicidal Thoughts:  No  Homicidal Thoughts:  No  Memory:  Immediate;   Good Recent;   Good Remote;   Good  Judgement:  Intact  Insight:  Present  Psychomotor Activity:  NA  Concentration:  Concentration: Good and Attention Span: Good  Recall:  Good  Fund of Knowledge:  Good  Language:  Good  Akathisia:  No  Handed:  Right  AIMS (if indicated):     Assets:  Communication Skills Desire for Improvement Housing Resilience Talents/Skills Transportation  ADL's:  Intact  Cognition:  WNL  Sleep:   good      Assessment and Plan: PTSD.  Panic attacks.  Patient is a stable on his current medication.  He like to increase Cymbalta which is helping his anxiety and PTSD symptoms.  He takes Klonopin for panic attacks and it helps his sleep.  Discussed medication side effects and benefits.  Recommended to call us back if is any question or any concern.  Follow-up in 3 months.  Follow Up Instructions:    I discussed the assessment and  treatment plan with the patient. The patient was provided an opportunity to ask questions and all were answered. The patient agreed with the plan and demonstrated an understanding of the instructions.   The patient was advised to call back or seek an in-person evaluation if the symptoms worsen or if the condition fails to improve as anticipated.  I provided 17 minutes of non-face-to-face time during this encounter.   Cleotis Nipper, MD

## 2020-08-19 ENCOUNTER — Other Ambulatory Visit: Payer: Self-pay

## 2020-08-19 ENCOUNTER — Telehealth (HOSPITAL_COMMUNITY): Payer: Medicaid Other | Admitting: Psychiatry

## 2020-09-12 ENCOUNTER — Encounter (HOSPITAL_COMMUNITY): Payer: Self-pay

## 2020-09-12 ENCOUNTER — Ambulatory Visit (HOSPITAL_COMMUNITY)
Admission: EM | Admit: 2020-09-12 | Discharge: 2020-09-12 | Disposition: A | Discharge status: Home / Self Care | Payer: Medicaid Other | Attending: Emergency Medicine | Admitting: Emergency Medicine

## 2020-09-12 ENCOUNTER — Other Ambulatory Visit: Payer: Self-pay

## 2020-09-12 DIAGNOSIS — Z888 Allergy status to other drugs, medicaments and biological substances status: Secondary | ICD-10-CM | POA: Diagnosis not present

## 2020-09-12 DIAGNOSIS — U071 COVID-19: Secondary | ICD-10-CM | POA: Insufficient documentation

## 2020-09-12 DIAGNOSIS — F419 Anxiety disorder, unspecified: Secondary | ICD-10-CM | POA: Insufficient documentation

## 2020-09-12 DIAGNOSIS — J069 Acute upper respiratory infection, unspecified: Secondary | ICD-10-CM

## 2020-09-12 DIAGNOSIS — I10 Essential (primary) hypertension: Secondary | ICD-10-CM | POA: Diagnosis not present

## 2020-09-12 DIAGNOSIS — F410 Panic disorder [episodic paroxysmal anxiety] without agoraphobia: Secondary | ICD-10-CM | POA: Insufficient documentation

## 2020-09-12 DIAGNOSIS — R051 Acute cough: Secondary | ICD-10-CM | POA: Diagnosis present

## 2020-09-12 DIAGNOSIS — Z79899 Other long term (current) drug therapy: Secondary | ICD-10-CM | POA: Diagnosis not present

## 2020-09-12 MED ORDER — IBUPROFEN 800 MG PO TABS: 800.0000 mg | ORAL_TABLET | Freq: Three times a day (TID) | ORAL | 0 refills | Status: DC | PRN

## 2020-09-12 MED ORDER — BENZONATATE 100 MG PO CAPS: 100.0000 mg | ORAL_CAPSULE | Freq: Three times a day (TID) | ORAL | 0 refills | Status: DC

## 2020-09-12 NOTE — ED Triage Notes (Addendum)
Pt present cough with sore throat, nasal congestion and drainage. Symptoms started 4 days ago. Pt state OTC medication with no relief.

## 2020-09-12 NOTE — ED Provider Notes (Signed)
[facility]    CSN: [id number] Arrival date & time: 09/12/20  1659      History   Chief Complaint Chief Complaint  Patient presents with  . Cough    HPI Brian [name] is a 29 y.o. male.   Patient presents with 4-day history of sore throat, nasal congestion, postnasal drip, runny nose, nonproductive cough.  He denies fever, rash, shortness of breath, vomiting, diarrhea, or other symptoms.  OTC treatment attempted at home.  His medical history includes hypertension, anxiety, panic attacks.  The history is provided by the patient and medical records.    Past Medical History:  Diagnosis Date  . Anxiety   . Hypertension     Patient Active Problem List   Diagnosis Date Noted  . Panic attacks 10/03/2015  . Palpitations 10/03/2015  . Gynecomastia, male 10/03/2015  . Lumbar disc disease 11/01/2011    Past Surgical History:  Procedure Laterality Date  . ANTERIOR CRUCIATE LIGAMENT REPAIR    . BACK SURGERY         Home Medications    Prior to Admission medications   Medication Sig Start Date End Date Taking? Authorizing Provider  benzonatate (TESSALON) 100 MG capsule Take 1 capsule (100 mg total) by mouth every 8 (eight) hours. 09/12/20  Yes Mickie Bail, NP  ibuprofen (ADVIL) 800 MG tablet Take 1 tablet (800 mg total) by mouth every 8 (eight) hours as needed. 09/12/20  Yes Mickie Bail, NP  clonazePAM (KLONOPIN) 0.5 MG tablet Take one tab at bed time and 2nd if needed for anxiety attack 05/20/20   Arfeen, Phillips Grout, MD  DULoxetine (CYMBALTA) 60 MG capsule Take 1 capsule (60 mg total) by mouth 2 (two) times daily. 05/20/20 05/20/21  Arfeen, Phillips Grout, MD  valsartan-hydrochlorothiazide (DIOVAN-HCT) 80-12.5 MG tablet Take 1 tablet by mouth daily.    Renaye Rakers, MD    Family History Family History  Problem Relation Age of Onset  . DiGeorge syndrome Sister   . Colon cancer Paternal Grandfather     Social History Social History   Tobacco Use  . Smoking status:  Never Smoker  . Smokeless tobacco: Current User    Types: Snuff  Vaping Use  . Vaping Use: Never used  Substance Use Topics  . Alcohol use: Yes    Alcohol/week: 0.0 standard drinks    Comment: occ  . Drug use: No     Allergies   Reglan [metoclopramide]   Review of Systems Review of Systems  Constitutional: Negative for chills and fever.  HENT: Positive for congestion, postnasal drip, rhinorrhea and sore throat. Negative for ear pain.   Eyes: Negative for pain and visual disturbance.  Respiratory: Positive for cough. Negative for shortness of breath.   Cardiovascular: Negative for chest pain and palpitations.  Gastrointestinal: Negative for abdominal pain, diarrhea and vomiting.  Genitourinary: Negative for dysuria and hematuria.  Musculoskeletal: Negative for arthralgias and back pain.  Skin: Negative for color change and rash.  Neurological: Negative for seizures and syncope.  All other systems reviewed and are negative.    Physical Exam Triage Vital Signs ED Triage Vitals  Enc Vitals Group     BP      Pulse      Resp      Temp      Temp src      SpO2      Weight      Height      Head Circumference  Peak Flow      Pain Score      Pain Loc      Pain Edu?      Excl. in GC?    No data found.  Updated Vital Signs BP (!) 144/76 (BP Location: Right Arm)   Pulse (!) 103   Temp 98.4 F (36.9 C)   Resp 18   SpO2 97%   Visual Acuity Right Eye Distance:   Left Eye Distance:   Bilateral Distance:    Right Eye Near:   Left Eye Near:    Bilateral Near:     Physical Exam Vitals and nursing note reviewed.  Constitutional:      General: He is not in acute distress.    Appearance: He is well-developed and well-nourished.  HENT:     Head: Normocephalic and atraumatic.     Nose: Rhinorrhea present.     Mouth/Throat:     Mouth: Mucous membranes are moist.     Pharynx: Oropharynx is clear.  Eyes:     Conjunctiva/sclera: Conjunctivae normal.   Cardiovascular:     Rate and Rhythm: Normal rate and regular rhythm.     Heart sounds: Normal heart sounds.  Pulmonary:     Effort: Pulmonary effort is normal. No respiratory distress.     Breath sounds: Normal breath sounds.  Abdominal:     Palpations: Abdomen is soft.     Tenderness: There is no abdominal tenderness. There is no guarding or rebound.  Musculoskeletal:        General: No edema.     Cervical back: Neck supple.  Skin:    General: Skin is warm and dry.     Findings: No rash.  Neurological:     General: No focal deficit present.     Mental Status: He is alert and oriented to person, place, and time.     Gait: Gait normal.  Psychiatric:        Mood and Affect: Mood and affect and mood normal.        Behavior: Behavior normal.      UC Treatments / Results  Labs (all labs ordered are listed, but only abnormal results are displayed) Labs Reviewed  SARS CORONAVIRUS 2 (TAT 6-24 HRS)    EKG   Radiology No results found.  Procedures Procedures (including critical care time)  Medications Ordered in UC Medications - No data to display  Initial Impression / Assessment and Plan / UC Course  I have reviewed the triage vital signs and the nursing notes.  Pertinent labs & imaging results that were available during my care of the patient were reviewed by me and considered in my medical decision making (see chart for details).   Viral URI with cough.  COVID pending.  Instructed patient to self quarantine until the test results are back.  Treating with ibuprofen and Tessalon Perles.  Discussed symptomatic treatment including Tylenol, rest, hydration.  Instructed patient to follow up with PCP if his symptoms are not improving.  Patient agrees to plan of care.    Final Clinical Impressions(s) / UC Diagnoses   Final diagnoses:  Viral URI with cough     Discharge Instructions     Your COVID test is pending.  You should self quarantine until the test result is  back.    Take ibuprofen as needed for fever or discomfort; Tessalon Perles as needed for cough.  Rest and keep yourself hydrated.    Follow-up with your primary care provider  if your symptoms are not improving.        ED Prescriptions    Medication Sig Dispense Auth. Provider   ibuprofen (ADVIL) 800 MG tablet Take 1 tablet (800 mg total) by mouth every 8 (eight) hours as needed. 21 tablet Mickie Bail, NP   benzonatate (TESSALON) 100 MG capsule Take 1 capsule (100 mg total) by mouth every 8 (eight) hours. 21 capsule Mickie Bail, NP     PDMP not reviewed this encounter.   Mickie Bail, NP 09/12/20 [phone]

## 2020-09-12 NOTE — Discharge Instructions (Addendum)
Your COVID test is pending.  You should self quarantine until the test result is back.    Take ibuprofen as needed for fever or discomfort; Tessalon Perles as needed for cough.  Rest and keep yourself hydrated.    Follow-up with your primary care provider if your symptoms are not improving.

## 2020-09-13 LAB — SARS CORONAVIRUS 2 (TAT 6-24 HRS): SARS Coronavirus 2: POSITIVE — AB

## 2020-09-14 ENCOUNTER — Ambulatory Visit (HOSPITAL_COMMUNITY)
Admission: EM | Admit: 2020-09-14 | Discharge: 2020-09-14 | Disposition: A | Discharge status: Home / Self Care | Payer: Medicaid Other | Attending: Emergency Medicine | Admitting: Emergency Medicine

## 2020-09-14 ENCOUNTER — Encounter (HOSPITAL_COMMUNITY): Payer: Self-pay | Admitting: *Deleted

## 2020-09-14 ENCOUNTER — Other Ambulatory Visit: Payer: Self-pay

## 2020-09-14 ENCOUNTER — Ambulatory Visit (INDEPENDENT_AMBULATORY_CARE_PROVIDER_SITE_OTHER): Payer: Medicaid Other

## 2020-09-14 DIAGNOSIS — U071 COVID-19: Secondary | ICD-10-CM

## 2020-09-14 MED ORDER — AEROCHAMBER PLUS MISC: 2 refills | Status: DC

## 2020-09-14 MED ORDER — ALBUTEROL SULFATE HFA 108 (90 BASE) MCG/ACT IN AERS: 1.0000 | INHALATION_SPRAY | RESPIRATORY_TRACT | 0 refills | Status: DC | PRN

## 2020-09-14 MED ORDER — IBUPROFEN 600 MG PO TABS: 600.0000 mg | ORAL_TABLET | Freq: Four times a day (QID) | ORAL | 0 refills | Status: AC | PRN

## 2020-09-14 MED ORDER — HYDROCOD POLST-CPM POLST ER 10-8 MG/5ML PO SUER: 5.0000 mL | Freq: Two times a day (BID) | ORAL | 0 refills | Status: DC | PRN

## 2020-09-14 NOTE — ED Provider Notes (Signed)
HPI  SUBJECTIVE:  Brian [name] is a 30 y.o. male who presents with worsening shortness of breath when he lies down.  States that he is waking up at night gasping for air 3 or 4 times a night.  Reports 3 pillow orthopnea.  He reports persistent headaches, nasal congestion, rhinorrhea, postnasal drip, sneezing, sore throat secondary to the cough, chest soreness secondary to cough.  He reports diffuse abdominal pain and diarrhea described as soreness.  No nausea, vomiting.  He states that he has been watching his oxygen levels and they have never gone below 97% since being diagnosed with COVID 2 days ago.  He took ibuprofen 800 mg within 8 hours of evaluation.  He has tried Tessalon, Delsym, ibuprofen 800 mg twice a day.  He states his symptoms are better in the daytime, worse at night and with lying down.  He denies leg swelling, unintentional weight gain, nocturia.  He has never had symptoms like this before.  Past medical history of questionable obstructive sleep apnea.  States he needs a sleep study.  Medical history of hypertension.  No history of congestive heart failure, coronary disease, pulmonary disease, smoking, diabetes, chronic kidney disease, PE, DVT.  JQB:HALPF, Adrian Saran, MD  Seen here on 1/30 with URI/COVID symptoms.  He was sent home with ibuprofen/Tylenol and Tessalon Perles.  Tested positive for Covid.  He had an echo February 21 which showed showed EF 50 to 55%.  Low normal function left ventricle, otherwise normal.  Past Medical History:  Diagnosis Date  . Anxiety   . Hypertension     Past Surgical History:  Procedure Laterality Date  . ANTERIOR CRUCIATE LIGAMENT REPAIR    . BACK SURGERY      Family History  Problem Relation Age of Onset  . DiGeorge syndrome Sister   . Colon cancer Paternal Grandfather     Social History   Tobacco Use  . Smoking status: Never Smoker  . Smokeless tobacco: Current User    Types: Snuff  Vaping Use  . Vaping Use: Never used  Substance  Use Topics  . Alcohol use: Yes    Alcohol/week: 0.0 standard drinks    Comment: occ  . Drug use: No    No current facility-administered medications for this encounter.  Current Outpatient Medications:  .  albuterol (VENTOLIN HFA) 108 (90 Base) MCG/ACT inhaler, Inhale 1-2 puffs into the lungs every 4 (four) hours as needed for wheezing or shortness of breath., Disp: 1 each, Rfl: 0 .  benzonatate (TESSALON) 100 MG capsule, Take 1 capsule (100 mg total) by mouth every 8 (eight) hours., Disp: 21 capsule, Rfl: 0 .  chlorpheniramine-HYDROcodone (TUSSIONEX PENNKINETIC ER) 10-8 MG/5ML SUER, Take 5 mLs by mouth every 12 (twelve) hours as needed for cough., Disp: 60 mL, Rfl: 0 .  DULoxetine (CYMBALTA) 60 MG capsule, Take 1 capsule (60 mg total) by mouth 2 (two) times daily., Disp: 30 capsule, Rfl: 2 .  ibuprofen (ADVIL) 600 MG tablet, Take 1 tablet (600 mg total) by mouth every 6 (six) hours as needed., Disp: 30 tablet, Rfl: 0 .  Spacer/Aero-Holding Chambers (AEROCHAMBER PLUS) inhaler, Use with inhaler, Disp: 1 each, Rfl: 2 .  valsartan-hydrochlorothiazide (DIOVAN-HCT) 80-12.5 MG tablet, Take 1 tablet by mouth daily., Disp: , Rfl:   Allergies  Allergen Reactions  . Reglan [Metoclopramide] Other (See Comments)    Sweating and hot sensation in jaws an genital area      ROS  As noted in HPI.   Physical Exam  BP 125/85 (BP Location: Right Arm)   Pulse 86   Temp 98.5 F (36.9 C) (Oral)   Resp 20   SpO2 99%   Constitutional: Well developed, well nourished, no acute distress. no shortness of breath with lying down flat. Eyes:  EOMI, conjunctiva normal bilaterally HENT: Normocephalic, atraumatic,mucus membranes moist Respiratory: Normal inspiratory effort, lungs clear bilaterally. Cardiovascular: Normal rate, regular rhythm no murmurs rubs gallops GI: nondistended soft, nontender.  Active bowel sounds.  No Guarding, rebound.  No tenderness over the liver, no hepatomegaly. skin: No rash,  skin intact Musculoskeletal: Calves symmetric, nontender, no edema Neurologic: Alert & oriented x 3, no focal neuro deficits Psychiatric: Speech and behavior appropriate   ED Course   Medications - No data to display  Orders Placed This Encounter  Procedures  . DG Chest 2 View    Standing Status:   Standing    Number of Occurrences:   1    Order Specific Question:   Reason for Exam (SYMPTOM  OR DIAGNOSIS REQUIRED)    Answer:   Covid positive.  Rule out pulmonary edema, effusion, pneumonia    No results found for this or any previous visit (from the past 24 hour(s)). DG Chest 2 View  Result Date: 09/14/2020 CLINICAL DATA:  COVID positive EXAM: CHEST - 2 VIEW COMPARISON:  None. FINDINGS: The heart size and mediastinal contours are within normal limits. Both lungs are clear. The visualized skeletal structures are unremarkable. IMPRESSION: No active cardiopulmonary disease. Electronically Signed   By: Jonna Clark M.D.   On: 09/14/2020 14:56    ED Clinical Impression  1. COVID-19 virus infection      ED Assessment/Plan  will check chest x-ray to rule out pneumonia, pulmonary edema, pleural effusion.  He has a normal EF on last year's echo although he has low left ventricular function.  Doubt PE in the absence of tachycardia, hypoxia, calf pain or swelling.  [place] Narcotic database reviewed for this patient, and feel that the risk/benefit ratio today is favorable for proceeding with a prescription for controlled substance.  He has a regular prescription of Klonopin from a single provider.  Last opiate prescription 8/20.  Reviewed imaging independently.  No pulmonary edema, pleural effusion, pneumonia.  See radiology report for full details.  No evidence of CHF, pneumonia, pleural effusion on x-ray.  Will continue to treat symptomatically.  Will send home with Tussionex since the Tessalon is not helping.  We can also try an albuterol inhaler with a spacer. will have him decrease his  ibuprofen from 800 mg to 600 mg combined with 1000 g 3-4 times a day.  He is to continue monitoring his pulse oximetry very closely.  Follow-up with his doctor or with the post Covid clinic in 5 days if not getting better.  No Klonopin if taking the Tussionex.  Wrote this down.  Strict ER return precautions given.  Discussed  imaging, MDM, treatment plan, and plan for follow-up with patient. Discussed sn/sx that should prompt return to the ED. patient agrees with plan.   Meds ordered this encounter  Medications  . albuterol (VENTOLIN HFA) 108 (90 Base) MCG/ACT inhaler    Sig: Inhale 1-2 puffs into the lungs every 4 (four) hours as needed for wheezing or shortness of breath.    Dispense:  1 each    Refill:  0  . Spacer/Aero-Holding Chambers (AEROCHAMBER PLUS) inhaler    Sig: Use with inhaler    Dispense:  1 each    Refill:  2    Please educate patient on use  . ibuprofen (ADVIL) 600 MG tablet    Sig: Take 1 tablet (600 mg total) by mouth every 6 (six) hours as needed.    Dispense:  30 tablet    Refill:  0  . chlorpheniramine-HYDROcodone (TUSSIONEX PENNKINETIC ER) 10-8 MG/5ML SUER    Sig: Take 5 mLs by mouth every 12 (twelve) hours as needed for cough.    Dispense:  60 mL    Refill:  0    *This clinic note was created using Scientist, clinical (histocompatibility and immunogenetics). Therefore, there may be occasional mistakes despite careful proofreading.   ?    Domenick Gong, MD 09/14/20 1531

## 2020-09-14 NOTE — Discharge Instructions (Addendum)
Do not take the Tussionex if you are taking Klonopin.  The combination can slow your breathing down to dangerous levels.  Take 600 mg of ibuprofen combined with 1000 mg of Tylenol together 3-4 times a day as needed.  You can try 2 puffs from your albuterol inhaler using a spacer every 4-6 hours for cough, shortness of breath.  Tussionex.  Coughing.  Continue monitoring oxygen levels closely.  Go to the ER if it is consistently below 90%.  Get out and walk a little every day.  It does not have to be fast or far, but it will keep your lungs open and help prevent a blood clot.  Follow-up with your doctor or with the post COVID care clinic in 5 days if you are not getting significantly better.

## 2020-09-14 NOTE — ED Triage Notes (Addendum)
PT positive for COVID 09-12-20 Sunday.Pt present today with increased SHOB  SPO2 100% during triage, ABD pain and cough. Pt reports he can not sleep at night due to the cough waking him up.

## 2020-09-16 ENCOUNTER — Encounter: Payer: Self-pay | Admitting: Physician Assistant

## 2020-09-16 ENCOUNTER — Other Ambulatory Visit: Payer: Self-pay | Admitting: Physician Assistant

## 2020-09-16 DIAGNOSIS — U071 COVID-19: Secondary | ICD-10-CM

## 2020-09-16 DIAGNOSIS — Z6834 Body mass index (BMI) 34.0-34.9, adult: Secondary | ICD-10-CM | POA: Insufficient documentation

## 2020-09-16 DIAGNOSIS — I10 Essential (primary) hypertension: Secondary | ICD-10-CM

## 2020-09-16 NOTE — Progress Notes (Signed)
I connected by phone with Brian [name] on 09/16/2020 at 3:43 PM to discuss the potential use of a new treatment for mild to moderate COVID-19 viral infection in non-hospitalized patients.  This patient is a 30 y.o. male that meets the FDA criteria for Emergency Use Authorization of COVID monoclonal antibody sotrovimab.  Has a (+) direct SARS-CoV-2 viral test result  Has mild or moderate COVID-19   Is NOT hospitalized due to COVID-19  Is within 10 days of symptom onset  Has at least one of the high risk factor(s) for progression to severe COVID-19 and/or hospitalization as defined in EUA.  Specific high risk criteria : BMI > 25, Cardiovascular disease or hypertension and Other high risk medical condition per CDC:  high SVI, vaccinated but not boosted.    I have spoken and communicated the following to the patient or parent/caregiver regarding COVID monoclonal antibody treatment:  1. FDA has authorized the emergency use for the treatment of mild to moderate COVID-19 in adults and pediatric patients with positive results of direct SARS-CoV-2 viral testing who are 64 years of age and older weighing at least 40 kg, and who are at high risk for progressing to severe COVID-19 and/or hospitalization.  2. The significant known and potential risks and benefits of COVID monoclonal antibody, and the extent to which such potential risks and benefits are unknown.  3. Information on available alternative treatments and the risks and benefits of those alternatives, including clinical trials.  4. Patients treated with COVID monoclonal antibody should continue to self-isolate and use infection control measures (e.g., wear mask, isolate, social distance, avoid sharing personal items, clean and disinfect "high touch" surfaces, and frequent handwashing) according to CDC guidelines.   5. The patient or parent/caregiver has the option to accept or refuse COVID monoclonal antibody treatment.  After reviewing  this information with the patient, the patient has agreed to receive one of the available covid 19 monoclonal antibodies and will be provided an appropriate fact sheet prior to infusion.   Sx onset 1/29. Vaccinated but not boosted. Set up for infusion 2/4 @ 10:30am   Cline Crock, PA-C 09/16/2020 3:43 PM

## 2020-09-17 ENCOUNTER — Ambulatory Visit (HOSPITAL_COMMUNITY): Payer: Medicaid Other

## 2020-09-18 ENCOUNTER — Ambulatory Visit (HOSPITAL_COMMUNITY): Payer: Medicaid Other

## 2020-09-20 ENCOUNTER — Ambulatory Visit (HOSPITAL_COMMUNITY): Payer: Medicaid Other

## 2020-10-08 ENCOUNTER — Other Ambulatory Visit (HOSPITAL_COMMUNITY): Payer: Self-pay | Admitting: Psychiatry

## 2020-10-08 DIAGNOSIS — F410 Panic disorder [episodic paroxysmal anxiety] without agoraphobia: Secondary | ICD-10-CM

## 2020-10-11 ENCOUNTER — Telehealth (HOSPITAL_COMMUNITY): Payer: Self-pay | Admitting: *Deleted

## 2020-10-11 DIAGNOSIS — F4310 Post-traumatic stress disorder, unspecified: Secondary | ICD-10-CM

## 2020-10-11 MED ORDER — CLONAZEPAM 0.5 MG PO TABS: 0.5000 mg | ORAL_TABLET | Freq: Two times a day (BID) | ORAL | 0 refills | Status: DC

## 2020-10-11 MED ORDER — DULOXETINE HCL 60 MG PO CPEP: 60.0000 mg | ORAL_CAPSULE | Freq: Two times a day (BID) | ORAL | 0 refills | Status: DC

## 2020-10-11 NOTE — Telephone Encounter (Signed)
Pt called requesting refills of both Cymbalta and Klonopin. Looks like another provider was writing for Klonopin and that appears to be d/c on last ED visit. Please Last Cymbalta Rx written on 05/20/21 with two fills. Pt No Show on 08/19/20 now has an upcoming appointment on 10/19/20. Please review and advise.

## 2020-10-11 NOTE — Telephone Encounter (Signed)
Please provide 2-week supply of Cymbalta and Klonopin.  Recommend to keep his next appointment.

## 2020-10-19 ENCOUNTER — Telehealth (HOSPITAL_COMMUNITY): Payer: Medicaid Other | Admitting: Psychiatry

## 2020-10-25 ENCOUNTER — Other Ambulatory Visit: Payer: Self-pay

## 2020-10-25 ENCOUNTER — Encounter (HOSPITAL_COMMUNITY): Payer: Self-pay | Admitting: Psychiatry

## 2020-10-25 ENCOUNTER — Telehealth (INDEPENDENT_AMBULATORY_CARE_PROVIDER_SITE_OTHER): Payer: Medicaid Other | Admitting: Psychiatry

## 2020-10-25 VITALS — Wt 250.0 lb

## 2020-10-25 DIAGNOSIS — F4310 Post-traumatic stress disorder, unspecified: Secondary | ICD-10-CM

## 2020-10-25 DIAGNOSIS — F410 Panic disorder [episodic paroxysmal anxiety] without agoraphobia: Secondary | ICD-10-CM | POA: Diagnosis not present

## 2020-10-25 MED ORDER — CLONAZEPAM 0.5 MG PO TABS
0.5000 mg | ORAL_TABLET | ORAL | 0 refills | Status: DC | PRN
Start: 2020-10-25 — End: 2020-10-27

## 2020-10-25 MED ORDER — DULOXETINE HCL 60 MG PO CPEP: 60.0000 mg | ORAL_CAPSULE | Freq: Every day | ORAL | 1 refills | Status: DC

## 2020-10-25 NOTE — Progress Notes (Signed)
Virtual Visit via Telephone Note  I connected with Brian [name] on 10/25/20 at  2:20 PM EDT by telephone and verified that I am speaking with the correct person using two identifiers.  Location: Patient: Home Provider: Home Office   I discussed the limitations, risks, security and privacy concerns of performing an evaluation and management service by telephone and the availability of in person appointments. I also discussed with the patient that there may be a patient responsible charge related to this service. The patient expressed understanding and agreed to proceed.   History of Present Illness: Patient is evaluated by phone session.  He apologized missing his appointment.  He was last evaluated in October.  He admitted being very busy at that time but now he is not traveling as much.  Last week he had a knee surgery.  This is his third time he had meniscus repair in his knee.  He has been doing better.  Slowly and gradually he is recovering.  He had stopped taking his Klonopin and Cymbalta because of recent surgery as he was taking pain medicine.  However he also admitted not compliant with Cymbalta and Klonopin even prior to surgery.  He admitted lately feeling anxious, nervous, poor sleep and occasionally nightmares.  He also feels dysphoria but denies any crying spells, feeling of hopelessness or worthlessness.  He is seeing his PCP Dr. Parke Simmers.  He like to restart the medication and to take it regularly.  His weight is unchanged from the past.  He admitted due to pain not able to walk as much.  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  He is hoping to stop the pain medicine tomorrow.  Past Psychiatric History:Reviewed. H/Opanic attacks. Given valium byDr Corky Sox.Saw therapist at [facility] mental health. [facility] We tried Trazodone but did not  helped. No h/oinpatient treatment, psychosis, suicidal attempt,abuse,paranoiaorself abusive behavior.   Psychiatric Specialty Exam: Physical Exam  Review of Systems  Weight 250 lb (113.4 kg).There is no height or weight on file to calculate BMI.  General Appearance: NA  Eye Contact:  NA  Speech:  Slow  Volume:  Normal  Mood:  Dysphoric  Affect:  NA  Thought Process:  Goal Directed  Orientation:  Full (Time, Place, and Person)  Thought Content:  Rumination  Suicidal Thoughts:  No  Homicidal Thoughts:  No  Memory:  Immediate;   Good Recent;   Good Remote;   Good  Judgement:  Fair  Insight:  Fair  Psychomotor Activity:  NA  Concentration:  Concentration: Fair and Attention Span: Fair  Recall:  Good  Fund of Knowledge:  Good  Language:  Good  Akathisia:  No  Handed:  Right  AIMS (if indicated):     Assets:  Communication Skills Desire for Improvement Housing Resilience Talents/Skills Transportation  ADL's:  Intact  Cognition:  WNL  Sleep:   fair      Assessment and Plan: PTSD.  Panic attacks.  Patient like to restart the medication and to take the medicine regularly to avoid any nightmares, flashbacks, insomnia and panic attack.  He did very well on low-dose Klonopin and Cymbalta.  I will continue Cymbalta 60 mg daily and [name] to help his panic attack and PTSD symptoms.  Discussed medication side effects and benefits.  Recommended to call us back if is  any question or any concern.  Follow-up in 3 months.  Follow Up Instructions:    I discussed the assessment and treatment plan with the patient. The patient was provided an opportunity to ask questions and all were answered. The patient agreed with the plan and demonstrated an understanding of the instructions.   The patient was advised to call back or seek an in-person evaluation if the symptoms worsen or if the condition fails to improve as anticipated.  I provided 19 minutes of non-face-to-face  time during this encounter.   Cleotis Nipper, MD

## 2020-10-27 ENCOUNTER — Other Ambulatory Visit (HOSPITAL_COMMUNITY): Payer: Self-pay | Admitting: *Deleted

## 2020-10-27 DIAGNOSIS — F410 Panic disorder [episodic paroxysmal anxiety] without agoraphobia: Secondary | ICD-10-CM

## 2020-10-27 MED ORDER — CLONAZEPAM 0.5 MG PO TABS: 0.5000 mg | ORAL_TABLET | Freq: Every day | ORAL | 0 refills | Status: DC | PRN

## 2020-12-06 ENCOUNTER — Other Ambulatory Visit: Payer: Self-pay

## 2020-12-06 ENCOUNTER — Telehealth (HOSPITAL_COMMUNITY): Payer: Medicaid Other | Admitting: Psychiatry

## 2020-12-08 ENCOUNTER — Encounter (HOSPITAL_COMMUNITY): Payer: Self-pay | Admitting: Psychiatry

## 2020-12-08 ENCOUNTER — Other Ambulatory Visit: Payer: Self-pay

## 2020-12-08 ENCOUNTER — Telehealth (INDEPENDENT_AMBULATORY_CARE_PROVIDER_SITE_OTHER): Payer: Medicaid Other | Admitting: Psychiatry

## 2020-12-08 VITALS — Wt 265.0 lb

## 2020-12-08 DIAGNOSIS — F410 Panic disorder [episodic paroxysmal anxiety] without agoraphobia: Secondary | ICD-10-CM

## 2020-12-08 DIAGNOSIS — F4310 Post-traumatic stress disorder, unspecified: Secondary | ICD-10-CM | POA: Diagnosis not present

## 2020-12-08 MED ORDER — DULOXETINE HCL 60 MG PO CPEP: 60.0000 mg | ORAL_CAPSULE | Freq: Every day | ORAL | 1 refills | Status: DC

## 2020-12-08 MED ORDER — DIAZEPAM 2 MG PO TABS: 2.0000 mg | ORAL_TABLET | Freq: Every day | ORAL | 1 refills | Status: DC

## 2020-12-08 NOTE — Progress Notes (Signed)
Virtual Visit via Telephone Note  I connected with Brian [name] on 12/08/20 at  2:40 PM EDT by telephone and verified that I am speaking with the correct person using two identifiers.  Location: Patient: Home Provider: Home Office   I discussed the limitations, risks, security and privacy concerns of performing an evaluation and management service by telephone and the availability of in person appointments. I also discussed with the patient that there may be a patient responsible charge related to this service. The patient expressed understanding and agreed to proceed.   History of Present Illness: Patient is evaluated by phone session.  He started him on Cymbalta and Klonopin but some nights he feels Klonopin not working as good.  He continues to have nightmares flashback.  He feels that Valium helped more when he took in the past.  He is taking Cymbalta and denies any panic attack.  He is busy working and hoping to move to a new house in few weeks.  He is also getting married on November 19.  He admitted weight gain in past few months because lack of exercise and mobilization due to knee pain.  He has 6 knee surgeries.  His struggle with pain but decided to stop taking all the muscle relaxant and narcotics because it did not help as much.  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  Denies any mania, psychosis, hallucination, paranoia, crying spells or any feeling of hopelessness or suicidal thoughts.  He has no tremor or shakes and like to keep the Cymbalta but wants to try back to Valium.  Past Psychiatric History:Reviewed. H/Opanic attacks. Given valium byDr Corky Sox.Saw therapist at [facility] health. [facility]. Trazodone did not helped. No h/oinpatient treatment, psychosis, suicidal attempt,abuse,paranoiaorself abusive  behavior.  Psychiatric Specialty Exam: Physical Exam  Review of Systems  Weight 265 lb (120.2 kg).There is no height or weight on file to calculate BMI.  General Appearance: NA  Eye Contact:  NA  Speech:  Normal Rate  Volume:  Normal  Mood:  Anxious  Affect:  NA  Thought Process:  Goal Directed  Orientation:  Full (Time, Place, and Person)  Thought Content:  Rumination  Suicidal Thoughts:  No  Homicidal Thoughts:  No  Memory:  Immediate;   Good Recent;   Good Remote;   Good  Judgement:  Good  Insight:  Present  Psychomotor Activity:  NA  Concentration:  Concentration: Good and Attention Span: Good  Recall:  Good  Fund of Knowledge:  Good  Language:  Good  Akathisia:  No  Handed:  Right  AIMS (if indicated):     Assets:  Communication Skills Desire for Improvement Housing Resilience Social Support Talents/Skills Transportation  ADL's:  Intact  Cognition:  WNL  Sleep:   fair      Assessment and Plan: PTSD.  Panic attacks.  Patient again weight because of not exercising as he has joint pains.  I encourage should consider therapy to help his coping skills.  Recommend to watch his calorie intake.  Like to try Valium which helped him in the past.  Even though the Klonopin helped in the past but this time he feels it is not working as good.  He does not want to take higher dose of Klonopin.  Discontinue Klonopin and try Valium 2 mg at bedtime.  Continue  Cymbalta 60 mg daily.  We will refer him for therapy for coping skills.  Recommended to call us back with any question of any concern.  Follow-up in 2 months.  Follow Up Instructions:    I discussed the assessment and treatment plan with the patient. The patient was provided an opportunity to ask questions and all were answered. The patient agreed with the plan and demonstrated an understanding of the instructions.   The patient was advised to call back or seek an in-person evaluation if the symptoms worsen or if the  condition fails to improve as anticipated.  I provided 17 minutes of non-face-to-face time during this encounter.   Cleotis Nipper, MD

## 2020-12-16 ENCOUNTER — Ambulatory Visit (HOSPITAL_COMMUNITY): Payer: Medicaid Other | Admitting: Clinical

## 2020-12-16 ENCOUNTER — Other Ambulatory Visit: Payer: Self-pay

## 2020-12-30 ENCOUNTER — Telehealth (HOSPITAL_COMMUNITY): Payer: Self-pay | Admitting: *Deleted

## 2020-12-30 DIAGNOSIS — F410 Panic disorder [episodic paroxysmal anxiety] without agoraphobia: Secondary | ICD-10-CM

## 2020-12-30 NOTE — Telephone Encounter (Signed)
Pt returned writer's call regarding switching back to Klonopin from the Diazepam 2 mg. Pt sates that the Valium is not working for sleep and that he's up most of the night therefore would like to d/c Valium and restart the Klonopin. Please review and advise. Thanks.

## 2020-12-30 NOTE — Telephone Encounter (Signed)
Pt called requesting to 'go back on the Klonopin" because the Valium "is not working". This nurse returned t/c but had to leave a VM requesting more information. Pt has an upcoming appointment on 02/07/21.

## 2020-12-31 MED ORDER — CLONAZEPAM 0.5 MG PO TABS
0.5000 mg | ORAL_TABLET | Freq: Every day | ORAL | 0 refills | Status: DC | PRN
Start: 2020-12-31 — End: 2021-01-31

## 2020-12-31 NOTE — Telephone Encounter (Signed)
D/C valium. Restart [name]. Prescription send to pharmacy.

## 2021-01-28 ENCOUNTER — Other Ambulatory Visit (HOSPITAL_COMMUNITY): Payer: Self-pay | Admitting: Psychiatry

## 2021-01-28 DIAGNOSIS — F410 Panic disorder [episodic paroxysmal anxiety] without agoraphobia: Secondary | ICD-10-CM

## 2021-01-31 ENCOUNTER — Telehealth (HOSPITAL_COMMUNITY): Payer: Self-pay | Admitting: *Deleted

## 2021-01-31 DIAGNOSIS — F410 Panic disorder [episodic paroxysmal anxiety] without agoraphobia: Secondary | ICD-10-CM

## 2021-01-31 MED ORDER — CLONAZEPAM 0.5 MG PO TABS: 0.5000 mg | ORAL_TABLET | Freq: Every day | ORAL | 0 refills | Status: DC | PRN

## 2021-01-31 NOTE — Telephone Encounter (Signed)
Pt called requesting refills of both Klonopin and Cymbalta. Cymbalta written on 12/08/20 with 1 refill so should be ok until next appointment. [name] on 12/31/20. Pt to take one tablet qd prn. Pt has an upcoming appointment on 02/07/21. Refill?

## 2021-01-31 NOTE — Telephone Encounter (Signed)
Done

## 2021-02-07 ENCOUNTER — Telehealth (HOSPITAL_COMMUNITY): Payer: Medicaid Other | Admitting: Psychiatry

## 2021-02-07 ENCOUNTER — Other Ambulatory Visit: Payer: Self-pay

## 2021-03-06 ENCOUNTER — Other Ambulatory Visit (HOSPITAL_COMMUNITY): Payer: Self-pay | Admitting: Psychiatry

## 2021-03-06 DIAGNOSIS — F410 Panic disorder [episodic paroxysmal anxiety] without agoraphobia: Secondary | ICD-10-CM

## 2021-03-08 ENCOUNTER — Other Ambulatory Visit (HOSPITAL_COMMUNITY): Payer: Self-pay | Admitting: *Deleted

## 2021-03-08 ENCOUNTER — Telehealth (HOSPITAL_COMMUNITY): Payer: Self-pay | Admitting: *Deleted

## 2021-03-08 DIAGNOSIS — F4310 Post-traumatic stress disorder, unspecified: Secondary | ICD-10-CM

## 2021-03-08 DIAGNOSIS — F410 Panic disorder [episodic paroxysmal anxiety] without agoraphobia: Secondary | ICD-10-CM

## 2021-03-08 MED ORDER — DULOXETINE HCL 60 MG PO CPEP: 60.0000 mg | ORAL_CAPSULE | Freq: Every day | ORAL | 1 refills | Status: DC

## 2021-03-08 MED ORDER — CLONAZEPAM 0.5 MG PO TABS: 0.5000 mg | ORAL_TABLET | Freq: Every day | ORAL | 0 refills | Status: DC | PRN

## 2021-03-08 NOTE — Telephone Encounter (Signed)
Erroneous encounter

## 2021-04-15 ENCOUNTER — Other Ambulatory Visit (HOSPITAL_COMMUNITY): Payer: Self-pay | Admitting: Psychiatry

## 2021-04-15 DIAGNOSIS — F410 Panic disorder [episodic paroxysmal anxiety] without agoraphobia: Secondary | ICD-10-CM

## 2021-04-19 ENCOUNTER — Other Ambulatory Visit (HOSPITAL_COMMUNITY): Payer: Self-pay | Admitting: Psychiatry

## 2021-04-19 DIAGNOSIS — F410 Panic disorder [episodic paroxysmal anxiety] without agoraphobia: Secondary | ICD-10-CM

## 2021-04-20 ENCOUNTER — Other Ambulatory Visit (HOSPITAL_COMMUNITY): Payer: Self-pay | Admitting: *Deleted

## 2021-04-20 DIAGNOSIS — F410 Panic disorder [episodic paroxysmal anxiety] without agoraphobia: Secondary | ICD-10-CM

## 2021-04-20 MED ORDER — CLONAZEPAM 0.5 MG PO TABS: 0.5000 mg | ORAL_TABLET | Freq: Every day | ORAL | 0 refills | Status: DC | PRN

## 2021-05-03 ENCOUNTER — Other Ambulatory Visit: Payer: Self-pay

## 2021-05-03 ENCOUNTER — Encounter (HOSPITAL_COMMUNITY): Payer: Self-pay | Admitting: Psychiatry

## 2021-05-03 ENCOUNTER — Telehealth (INDEPENDENT_AMBULATORY_CARE_PROVIDER_SITE_OTHER): Payer: Medicaid Other | Admitting: Psychiatry

## 2021-05-03 VITALS — Wt 260.0 lb

## 2021-05-03 DIAGNOSIS — F4310 Post-traumatic stress disorder, unspecified: Secondary | ICD-10-CM

## 2021-05-03 DIAGNOSIS — F410 Panic disorder [episodic paroxysmal anxiety] without agoraphobia: Secondary | ICD-10-CM

## 2021-05-03 MED ORDER — DOXEPIN HCL 10 MG PO CAPS
10.0000 mg | ORAL_CAPSULE | Freq: Every day | ORAL | 1 refills | Status: DC
Start: 2021-05-03 — End: 2021-06-10

## 2021-05-03 MED ORDER — CLONAZEPAM 0.5 MG PO TABS: 0.5000 mg | ORAL_TABLET | Freq: Every day | ORAL | 0 refills | Status: DC | PRN

## 2021-05-03 MED ORDER — DULOXETINE HCL 60 MG PO CPEP: 60.0000 mg | ORAL_CAPSULE | Freq: Every day | ORAL | 1 refills | Status: DC
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
## 2021-05-23 ENCOUNTER — Ambulatory Visit (INDEPENDENT_AMBULATORY_CARE_PROVIDER_SITE_OTHER): Payer: Medicaid Other | Admitting: Neurology

## 2021-05-23 ENCOUNTER — Encounter: Payer: Self-pay | Admitting: Neurology

## 2021-05-23 VITALS — BP 143/97 | HR 75 | Ht 72.0 in | Wt 284.5 lb

## 2021-05-23 DIAGNOSIS — F4310 Post-traumatic stress disorder, unspecified: Secondary | ICD-10-CM

## 2021-05-23 DIAGNOSIS — F410 Panic disorder [episodic paroxysmal anxiety] without agoraphobia: Secondary | ICD-10-CM | POA: Insufficient documentation

## 2021-05-23 DIAGNOSIS — R0683 Snoring: Secondary | ICD-10-CM | POA: Diagnosis not present

## 2021-05-23 DIAGNOSIS — G8929 Other chronic pain: Secondary | ICD-10-CM | POA: Insufficient documentation

## 2021-05-23 DIAGNOSIS — F5104 Psychophysiologic insomnia: Secondary | ICD-10-CM | POA: Insufficient documentation

## 2021-05-23 DIAGNOSIS — G4733 Obstructive sleep apnea (adult) (pediatric): Secondary | ICD-10-CM | POA: Insufficient documentation

## 2021-05-23 NOTE — Patient Instructions (Addendum)
Quality Sleep Information, Adult Quality sleep is important for your mental and physical health. It also improves your quality of life. Quality sleep means you: Are asleep for most of the time you are in bed. Fall asleep within 30 minutes. Wake up no more than once a night.  Are awake for no longer than 20 minutes if you do wake up during the night. Most adults need 7-8 hours of quality sleep each night. How can poor sleep affect me? If you do not get enough quality sleep, you may have: Mood swings. Daytime sleepiness. Confusion. Decreased reaction time. Sleep disorders, such as insomnia and sleep apnea. Difficulty with: Solving problems. Coping with stress. Paying attention. These issues may affect your performance and productivity at work, school, and at home. Lack of sleep may also put you at higher risk for accidents, suicide, and risky behaviors. If you do not get quality sleep you may also be at higher risk for several health problems, including: Infections. Type 2 diabetes. Heart disease. High blood pressure. Obesity. Worsening of long-term conditions, like arthritis, kidney disease, depression, Parkinson's disease, and epilepsy. What actions can I take to get more quality sleep?   Stick to a sleep schedule. Go to sleep and wake up at about the same time each day. Do not try to sleep less on weekdays and make up for lost sleep on weekends. This does not work. Try to get about 30 minutes of exercise on most days. Do not exercise 2-3 hours before going to bed. Limit naps during the day to 30 minutes or less. Do not use any products that contain nicotine or tobacco, such as cigarettes or e-cigarettes. If you need help quitting, ask your health care provider. Do not drink caffeinated beverages for at least 8 hours before going to bed. Coffee, tea, and some sodas contain caffeine. Do not drink alcohol close to bedtime. Do not eat large meals close to bedtime. Do not take naps in  the late afternoon. Try to get at least 30 minutes of sunlight every day. Morning sunlight is best. Make time to relax before bed. Reading, listening to music, or taking a hot bath promotes quality sleep. Make your bedroom a place that promotes quality sleep. Keep your bedroom dark, quiet, and at a comfortable room temperature. Make sure your bed is comfortable. Take out sleep distractions like TV, a computer, smartphone, and bright lights. If you are lying awake in bed for longer than 20 minutes, get up and do a relaxing activity until you feel sleepy. Work with your health care provider to treat medical conditions that may affect sleeping, such as: Nasal obstruction. Snoring. Sleep apnea and other sleep disorders. Talk to your health care provider if you think any of your prescription medicines may cause you to have difficulty falling or staying asleep. If you have sleep problems, talk with a sleep consultant. If you think you have a sleep disorder, talk with your health care provider about getting evaluated by a specialist. Where to find more information National Sleep Foundation website: https://sleepfoundation.org National Heart, Lung, and Blood Institute (NHLBI): www.nhlbi.nih.gov/files/docs/public/sleep/healthy_sleep.pdf Centers for Disease Control and Prevention (CDC): www.cdc.gov/sleep/index.html Contact a health care provider if you: Have trouble getting to sleep or staying asleep. Often wake up very early in the morning and cannot get back to sleep. Have daytime sleepiness. Have daytime sleep attacks of suddenly falling asleep and sudden muscle weakness (narcolepsy). Have a tingling sensation in your legs with a strong urge to move your legs (restless   legs syndrome). Stop breathing briefly during sleep (sleep apnea). Think you have a sleep disorder or are taking a medicine that is affecting your quality of sleep. Summary Most adults need 7-8 hours of quality sleep each  night. Getting enough quality sleep is an important part of health and well-being. Make your bedroom a place that promotes quality sleep and avoid things that may cause you to have poor sleep, such as alcohol, caffeine, smoking, and large meals. Talk to your health care provider if you have trouble falling asleep or staying asleep. This information is not intended to replace advice given to you by your health care provider. Make sure you discuss any questions you have with your health care provider. Document Revised: 11/07/2017 Document Reviewed: 11/07/2017 Elsevier Patient Education  2022 [employer] CPAP and BPAP Information CPAP and BPAP (also called BiPAP) are methods that use air pressure to keep your airways open and to help you breathe well. CPAP and BPAP use different amounts of pressure. Your health care provider will tell you whether CPAP or BPAP would be more helpful for you. CPAP stands for "continuous positive airway pressure." With CPAP, the amount of pressure stays the same while you breathe in (inhale) and out (exhale). BPAP stands for "bi-level positive airway pressure." With BPAP, the amount of pressure will be higher when you inhale and lower when you exhale. This allows you to take larger breaths. CPAP or BPAP may be used in the hospital, or your health care provider may want you to use it at home. You may need to have a sleep study before your health care provider can order a machine for you to use at home. What are the advantages? CPAP or BPAP can be helpful if you have: Sleep apnea. Chronic obstructive pulmonary disease (COPD). Heart failure. Medical conditions that cause muscle weakness, including muscular dystrophy or amyotrophic lateral sclerosis (ALS). Other problems that cause breathing to be shallow, weak, abnormal, or difficult. CPAP and BPAP are most commonly used for obstructive sleep apnea (OSA) to keep the airways from collapsing when the muscles relax during  sleep. What are the risks? Generally, this is a safe treatment. However, problems may occur, including: Irritated skin or skin sores if the mask does not fit properly. Dry or stuffy nose or nosebleeds. Dry mouth. Feeling gassy or bloated. Sinus or lung infection if the equipment is not cleaned properly. When should CPAP or BPAP be used? In most cases, the mask only needs to be worn during sleep. Generally, the mask needs to be worn throughout the night and during any daytime naps. People with certain medical conditions may also need to wear the mask at other times, such as when they are awake. Follow instructions from your health care provider about when to use the machine. What happens during CPAP or BPAP? Both CPAP and BPAP are provided by a small machine with a flexible plastic tube that attaches to a plastic mask that you wear. Air is blown through the mask into your nose or mouth. The amount of pressure that is used to blow the air can be adjusted on the machine. Your health care provider will set the pressure setting and help you find the best mask for you. Tips for using the mask Because the mask needs to be snug, some people feel trapped or closed-in (claustrophobic) when first using the mask. If you feel this way, you may need to get used to the mask. One way to do this is to   hold the mask loosely over your nose or mouth and then gradually apply the mask more snugly. You can also gradually increase the amount of time that you use the mask. Masks are available in various types and sizes. If your mask does not fit well, talk with your health care provider about getting a different one. Some common types of masks include: Full face masks, which fit over the mouth and nose. Nasal masks, which fit over the nose. Nasal pillow or prong masks, which fit into the nostrils. If you are using a mask that fits over your nose and you tend to breathe through your mouth, a chin strap may be applied to  help keep your mouth closed. Use a skin barrier to protect your skin as told by your health care provider. Some CPAP and BPAP machines have alarms that may sound if the mask comes off or develops a leak. If you have trouble with the mask, it is very important that you talk with your health care provider about finding a way to make the mask easier to tolerate. Do not stop using the mask. There could be a negative impact on your health if you stop using the mask. Tips for using the machine Place your CPAP or BPAP machine on a secure table or stand near an electrical outlet. Know where the on/off switch is on the machine. Follow instructions from your health care provider about how to set the pressure on your machine and when you should use it. Do not eat or drink while the CPAP or BPAP machine is on. Food or fluids could get pushed into your lungs by the pressure of the CPAP or BPAP. For home use, CPAP and BPAP machines can be rented or purchased through home health care companies. Many different brands of machines are available. Renting a machine before purchasing may help you find out which particular machine works well for you. Your health insurance company may also decide which machine you may get. Keep the CPAP or BPAP machine and attachments clean. Ask your health care provider for specific instructions. Check the humidifier if you have a dry stuffy nose or nosebleeds. Make sure it is working correctly. Follow these instructions at home: Take over-the-counter and prescription medicines only as told by your health care provider. Ask if you can take sinus medicine if your sinuses are blocked. Do not use any products that contain nicotine or tobacco. These products include cigarettes, chewing tobacco, and vaping devices, such as e-cigarettes. If you need help quitting, ask your health care provider. Keep all follow-up visits. This is important. Contact a health care provider if: You have redness or  pressure sores on your head, face, mouth, or nose from the mask or head gear. You have trouble using the CPAP or BPAP machine. You cannot tolerate wearing the CPAP or BPAP mask. Someone tells you that you snore even when wearing your CPAP or BPAP. Get help right away if: You have trouble breathing. You feel confused. Summary CPAP and BPAP are methods that use air pressure to keep your airways open and to help you breathe well. If you have trouble with the mask, it is very important that you talk with your health care provider about finding a way to make the mask easier to tolerate. Do not stop using the mask. There could be a negative impact to your health if you stop using the mask. Follow instructions from your health care provider about when to use the machine.   This information is not intended to replace advice given to you by your health care provider. Make sure you discuss any questions you have with your health care provider. Document Revised: 07/09/2020 Document Reviewed: 07/09/2020 Elsevier Patient Education  2022 [employer] Managing Post-Traumatic Stress Disorder If you have been diagnosed with post-traumatic stress disorder (PTSD), you may be relieved that you now know why you have felt or behaved a certain way. Still, you may feel overwhelmed about the treatment ahead. You may also wonder how to get the support you need and how to deal with the condition day-to-day. If you are living with PTSD, there are ways to help you recover from it and manage your symptoms. How to manage lifestyle changes Managing stress Stress is your body's reaction to life changes and events, both good and bad. Stress can make PTSD worse. Take the following steps to manage stress: Talk with your health care provider or a counselor if you would like to learn more about techniques to reduce your stress. He or she may suggest some stress reduction techniques such as: Muscle relaxation exercises. Regular  exercise. Meditation, yoga, or other mind-body exercises. Breathing exercises. Listening to quiet music. Spending time outside. Maintain a healthy lifestyle. Eat a healthy diet, exercise regularly, get plenty of sleep, and take time to relax. Spend time with others. Talk with them about how you are feeling and what kind of support you need. Try not to isolate yourself, even though you may feel like doing that. Isolating yourself can delay your recovery. Do activities and hobbies that you enjoy. Pace yourself when doing stressful things. Take breaks, and reward yourself when you finish. Make sure that you do not overload your schedule.  Medicines Your health care provider may suggest certain medicines if he or she feels that they will help to improve your condition. Medicines for depression (antidepressants) or severe loss of contact with reality (antipsychotics) may be used to treat PTSD. Avoid using alcohol and other substances that may prevent your medicines from working properly. It is also important to: Talk with your pharmacist or health care provider about all medicines that you take, their possible side effects, and which medicines are safe to take together. Make it your goal to take part in all treatment decisions (shared decision-making). Ask about possible side effects of medicines that your health care provider recommends, and tell him or her how you feel about having those side effects. It is best if shared decision-making with your health care provider is part of your total treatment plan. If your health care provider prescribes a medicine, you may not notice the full benefits of it for 4-8 weeks. Most people who are treated for PTSD need to take medicine for at least 6-12 months before they feel better. If you are taking medicines as part of your treatment, do not stop taking medicines before you ask your health care provider if it is safe to stop. You may need to have the medicine  slowly decreased (tapered) over time to lower the risk of harmful side effects. Relationships Many people who have PTSD have difficulty trusting others. Make an effort to: Take risks and develop trust with close friends and family members. Developing trust in others can help you feel safe and connect you with emotional support. Be open and honest about your feelings. Have fun and relax in safe spaces, such as with friends and family. Think about going to couples counseling, family education classes, or family therapy. Your loved  ones may not always know how to be supportive. Therapy can be helpful for everyone. How to recognize changes in your condition Be aware of your symptoms and how often you have them. The following symptoms mean that you need to seek help for your PTSD: You feel suspicious and angry. You have repeated flashbacks. You avoid going out or being with others. You have an increasing number of fights with close friends or family members, such as your spouse. You have thoughts about hurting yourself or others. You cannot get relief from feelings of depression or anxiety. Follow these instructions at home: Lifestyle Exercise regularly. Try to do 30 or more minutes of physical activity on most days of the week. Try to get 7-9 hours of sleep each night. To help with sleep: Keep your bedroom cool and dark. Avoid screen time before bedtime. This means avoiding use of your TV, computer, tablet, and cell phone. Practice self-soothing skills and use them daily. Try to have fun and seek humor in your life. Eating and drinking Do not eat a heavy meal during the hour before you go to bed. Do not drink alcohol or caffeinated drinks before bed. Avoid using alcohol or drugs. General instructions If your PTSD is affecting your marriage or family, seek help from a family therapist. Remind yourself that recovering from the trauma is a process and takes time. Take over-the-counter and  prescription medicines only as told by your health care provider. Make sure to let all of your health care providers know that you have PTSD. This is especially important if you are having surgery or need to be admitted to the hospital. Keep all follow-up visits as told by your health care providers. This is important. Where to find support Talking to others Explain that PTSD is a mental health problem. It is something that a person can develop after experiencing or seeing a life-threatening event. Tell them that PTSD makes you feel stress like you did during the event. Talk to your loved ones about the symptoms you have. Also tell them what things or situations can cause symptoms to start (are triggers for you). Assure your loved ones that there are treatments to help PTSD. Discuss possibly seeking family therapy or couples therapy. If you are worried or fearful about seeking treatment, ask for support. Keep daily contact with at least one trusted friend or family member. Finances Not all insurance plans cover mental health care, so it is important to check with your insurance carrier. If paying for co-pays or counseling services is a problem, search for a local or county mental health care center. Public mental health care services may be offered there at a low cost or no cost when you are not able to see a private health care provider. If you are a veteran, contact a local veterans organization or veterans hospital for more information. If you are taking medicine for PTSD, you may be able to get the genericform, which may be less expensive than brand-name medicine. Some makers of prescription medicines also offer help to patients who cannot afford the medicines that they need. Therapy and support groups Find a support group in your community. Often, groups are available for [employer] veterans, trauma victims, and family members or caregivers. Look into volunteer opportunities. Taking part in these  can help you feel more connected to your community. Contact a local organization to find out if you are eligible for a service dog. Where to find more information Go to this website  to find more information about PTSD, treatment of PTSD, and how to get support: [facility] for PTSD: www.ptsd.FitBoxer.tn Contact a health care provider if: Your symptoms get worse or do not get better. Get help right away if: You have thoughts about hurting yourself or others. If you ever feel like you may hurt yourself or others, or have thoughts about taking your own life, get help right away. You can go to your nearest emergency department or call: Your local emergency services (911 in the [place] A suicide crisis helpline, such as the National Suicide Prevention Lifeline at 984-786-1674. This is open 24-hours a day. Summary If you are living with PTSD, there are ways to help you recover from it and manage your symptoms. Find supportive environments and people who understand PTSD. Spend time in those places, and maintain contact with those people. Work with your health care team to create a plan for managing PTSD. The plan should include counseling, stress reduction techniques, and healthy lifestyle habits. This information is not intended to replace advice given to you by your health care provider. Make sure you discuss any questions you have with your health care provider. Document Revised: 04/16/2020 Document Reviewed: 04/16/2020 Elsevier Patient Education  2022 [employer].

## 2021-05-23 NOTE — Progress Notes (Signed)
SLEEP MEDICINE CLINIC    Provider:  Melvyn Novas, MD  Primary Care Physician:  Renaye Rakers, MD [address] [place] [address]     Referring Provider: Lewanda Rife, [place] [address] [place],  [place] [address]          Chief Complaint according to patient   Patient presents with:     New Patient (Initial Visit)           HISTORY OF PRESENT ILLNESS: 05-23-2021 Brian [name] is a 30 y.o. African American male patient and seen here as a referral on 05/23/2021 from PCP  for a sleep consultation..  Chief concern according to patient :  I have never been tested but I have a suspicion, my wife tells me to turn over when I snore loudly and I pause in my breathing. She counted up to 15 seconds .    I have the pleasure of seeing Brian [name] today, a right -handed patient with a possible sleep disorder.  He has a past medical history of Anxiety, Osteoarthritis and knee sports injuries, BMI 38,adult, and Hypertension. Donor meniscus in the right knee. Gained weight due to lack of physical exercise.    Sleep relevant medical history: Nocturia more than twice, Adenoidectomy  but no Tonsillectomy, knee pain.  Family medical /sleep history: No other family member on CPAP with OSA.  Social history:  Patient is working as a Network engineer, Psychologist, sport and exercise, and lives in a household with spouse and one son- The patient currently works 7 days  week. Tobacco use; snuff.  ETOH use ; rare ,  Caffeine intake in form of Coffee( /) Soda( /) Tea ( /) or energy drinks. Regular exercise in form of work.        Sleep habits are as follows: The patient's dinner time is between 7 PM. The patient goes to bed at 12 PM and continues to sleep for intervals of 1-2 hours, wakes up coughing, sometimes,  knee pain,  bathroom breaks, the first time at 1 AM.   The preferred sleep position is sideways, with the support of 1 pillow on a flat bed. Dreams are reportedly rare/  infrequent.  8  AM is the usual rise time. The patient wakes up spontaneously.  He reports not feeling refreshed or restored in AM, with symptoms such as dry mouth, morning headaches, and residual fatigue.  Naps are taken infrequently, reports he can't nap.   Review of Systems: Out of a complete 14 system review, the patient complains of only the following symptoms, and all other reviewed systems are negative.:  Fatigue, sleepiness , snoring, fragmented sleep, Insomnia due to pain and choking, possibly snoring.    How likely are you to doze in the following situations: 0 = not likely, 1 = slight chance, 2 = moderate chance, 3 = high chance   Sitting and Reading? Watching Television? Sitting inactive in a public place (theater or meeting)? As a passenger in a car for an hour without a break? Lying down in the afternoon when circumstances permit? Sitting and talking to someone? Sitting quietly after lunch without alcohol? In a car, while stopped for a few minutes in traffic?   Total = 07/ 24 points   FSS endorsed at 55/ 63 points.   Depression may be present.   Social History   Socioeconomic History   Marital status: Significant Other    Spouse name: Brian [name]  Number of children: 1   Years of education: Not on file   Highest education level: Some college, no degree  Occupational History   Not on file  Tobacco Use   Smoking status: Never   Smokeless tobacco: Current    Types: Snuff  Vaping Use   Vaping Use: Never used  Substance and Sexual Activity   Alcohol use: Yes    Alcohol/week: 0.0 standard drinks    Comment: occ   Drug use: No   Sexual activity: Yes    Birth control/protection: Condom, Pill  Other Topics Concern   Not on file  Social History Narrative   Lives with fiance   Right handed   Caffeine: very seldom   Social Determinants of Corporate investment banker Strain: Not on file  Food Insecurity: Not on file  Transportation Needs: Not on file   Physical Activity: Not on file  Stress: Not on file  Social Connections: Not on file    Family History  Problem Relation Age of Onset   Diabetes type II Mother    High Cholesterol Father    High blood pressure Father    DiGeorge syndrome Sister    High blood pressure Maternal Grandmother    Kidney disease Maternal Grandmother    Cancer Maternal Grandmother    Cancer Maternal Grandfather    Cancer Paternal Grandmother    High blood pressure Paternal Grandfather    Cancer Paternal Grandfather    Colon cancer Paternal Grandfather     Past Medical History:  Diagnosis Date   Anxiety    BMI 34.0-34.9,adult    Hypertension     Past Surgical History:  Procedure Laterality Date   ANTERIOR CRUCIATE LIGAMENT REPAIR     BACK SURGERY       Current Outpatient Medications on File Prior to Visit  Medication Sig Dispense Refill   albuterol (VENTOLIN HFA) 108 (90 Base) MCG/ACT inhaler Inhale 1-2 puffs into the lungs every 4 (four) hours as needed for wheezing or shortness of breath. 1 each 0   clonazePAM (KLONOPIN) 0.5 MG tablet Take 1 tablet (0.5 mg total) by mouth daily as needed for anxiety (panic attacks). 30 tablet 0   doxepin (SINEQUAN) 10 MG capsule Take 1 capsule (10 mg total) by mouth at bedtime. 30 capsule 1   DULoxetine (CYMBALTA) 60 MG capsule Take 1 capsule (60 mg total) by mouth daily. 30 capsule 1   ibuprofen (ADVIL) 600 MG tablet Take 1 tablet (600 mg total) by mouth every 6 (six) hours as needed. 30 tablet 0   valsartan-hydrochlorothiazide (DIOVAN-HCT) 80-12.5 MG tablet Take 1 tablet by mouth daily.     No current facility-administered medications on file prior to visit.    Allergies  Allergen Reactions   Reglan [Metoclopramide] Other (See Comments)    Sweating and hot sensation in jaws an genital area     Physical exam:  Today's Vitals   05/23/21 1150  BP: (!) 143/97  Pulse: 75  Weight: 284 lb 8 oz (129 kg)  Height: 6' (1.829 m)   Body mass index is  38.59 kg/m.   Wt Readings from Last 3 Encounters:  05/23/21 284 lb 8 oz (129 kg)  04/25/18 256 lb (116.1 kg)  02/20/18 250 lb (113.4 kg)     Ht Readings from Last 3 Encounters:  05/23/21 6' (1.829 m)  04/25/18 5\' 11"  (1.803 m)  02/20/18 5\' 11"  (1.803 m)      General: The patient is awake, alert and  appears not in acute distress. The patient is well groomed. Head: Normocephalic, atraumatic. Neck is supple. Mallampati 2 , elongated uvula. ,  neck circumference:20 inches .  Nasal airflow  patent.  Retrognathia is not seen.  Dental status: wore braces.  Cardiovascular:  Regular rate and cardiac rhythm by pulse,  without distended neck veins. Respiratory: Lungs are clear to auscultation.  Skin:  Without evidence of ankle edema, or rash. Trunk: The patient's posture is erect.   Neurologic exam : The patient is awake and alert, oriented to place and time.   Memory subjective described as intact.  Attention span & concentration ability appears normal.  Speech is fluent,  without  dysarthria, dysphonia or aphasia.  Mood and affect are appropriate.   Cranial nerves: no loss of smell or taste reported  Pupils are equal and briskly reactive to light. Funduscopic exam deferred. .  Extraocular movements in vertical and horizontal planes were intact and without nystagmus. No Diplopia. Visual fields by finger perimetry are intact. Hearing was intact to soft voice and finger rubbing.    Facial sensation intact to fine touch.  Facial motor strength is symmetric and tongue and uvula move midline.  Neck ROM : rotation, tilt and flexion extension were normal for age and shoulder shrug was symmetrical.    Motor exam:  Symmetric bulk, tone and ROM.   Normal tone without cog wheeling, symmetric grip strength .   Sensory:  Fine touch, pinprick and vibration were tested  and  normal.  Proprioception tested in the upper extremities was normal.   Coordination: Rapid alternating movements in the  fingers/hands were of normal speed.  The Finger-to-nose maneuver was intact without evidence of ataxia, dysmetria or tremor.   Gait and station: Patient could rise unassisted from a seated position, walked without assistive device.  Stance is of normal width/ base and the patient turned with 3 steps. He reports his knees often lock or give way and he has changed his gait since last knee surgery.  Toe and heel walk were deferred.  Deep tendon reflexes: in the  upper and lower extremities are symmetric and intact.  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  Trazodone was tried but not well tolerated.  Never tried  minipress/ catapress.    1) patient with history of loud snoring, witnessed apneas and insomnia. OSA risk factors are anatomy and weight.  2) excessive fatigue ore than sleepiness. Pain affects his sleep as well as PTSD.  3) PTSD from 2018 on. 4) memory impairment , possible related to diazepam etc.    My Plan is to proceed with:  1) sleep study based on witnessed apnea, this can be in house or at home. Treatment with CPAP if indicated.  2) PTSD related counseling is starting back today. He may address catapres as a possible treatment. Recurring nightmares.  3) waking with headaches, can be related to hypoxia.   I would like to thank Renaye Rakers, MD and Lewanda Rife, [place] [address] [place],  [place] [address] for allowing me to meet with and to take care  of this pleasant patient.   In short, Brian [name] is presenting with witnessed apneas, high fatigue, PTSD, Insomnia, pain related  insomnia. , a symptom that can be attributed to OSA and PTSD,.  I plan to follow up either personally or through our NP within 2-4 month.   CC: I will share my notes with PCP. Marland Kitchen  Electronically signed by: Melvyn Novas, MD 05/23/2021 12:03 PM  [facility] Board certified by The [employer] of Sleep Medicine and Diplomate of the [employer] of Sleep Medicine. Board certified In Neurology through the ABPN, Fellow of the [employer] of Neurology. Medical Director of [facility].

## 2021-06-07 ENCOUNTER — Other Ambulatory Visit (HOSPITAL_COMMUNITY): Payer: Self-pay | Admitting: Psychiatry

## 2021-06-07 DIAGNOSIS — F410 Panic disorder [episodic paroxysmal anxiety] without agoraphobia: Secondary | ICD-10-CM

## 2021-06-10 ENCOUNTER — Telehealth (HOSPITAL_COMMUNITY): Payer: Self-pay | Admitting: *Deleted

## 2021-06-10 ENCOUNTER — Other Ambulatory Visit (HOSPITAL_COMMUNITY): Payer: Self-pay | Admitting: *Deleted

## 2021-06-10 DIAGNOSIS — F410 Panic disorder [episodic paroxysmal anxiety] without agoraphobia: Secondary | ICD-10-CM

## 2021-06-10 MED ORDER — CLONAZEPAM 0.5 MG PO TABS: 0.5000 mg | ORAL_TABLET | Freq: Every day | ORAL | 0 refills | Status: DC | PRN

## 2021-06-10 NOTE — Telephone Encounter (Signed)
Pt called requesting refill of [name] last filled 05/03/21. Pt is going out of town and would like refill today. Please review. Thanks.

## 2021-06-23 ENCOUNTER — Telehealth (HOSPITAL_BASED_OUTPATIENT_CLINIC_OR_DEPARTMENT_OTHER): Payer: Medicaid Other | Admitting: Psychiatry

## 2021-06-23 ENCOUNTER — Other Ambulatory Visit: Payer: Self-pay

## 2021-06-23 ENCOUNTER — Encounter (HOSPITAL_COMMUNITY): Payer: Self-pay | Admitting: Psychiatry

## 2021-06-23 VITALS — Wt 270.0 lb

## 2021-06-23 DIAGNOSIS — F4310 Post-traumatic stress disorder, unspecified: Secondary | ICD-10-CM | POA: Diagnosis not present

## 2021-06-23 DIAGNOSIS — F410 Panic disorder [episodic paroxysmal anxiety] without agoraphobia: Secondary | ICD-10-CM | POA: Diagnosis not present

## 2021-06-23 MED ORDER — BUSPIRONE HCL 5 MG PO TABS: 30.0000 mg | ORAL_TABLET | Freq: Two times a day (BID) | ORAL | 1 refills | Status: DC

## 2021-06-23 MED ORDER — DULOXETINE HCL 60 MG PO CPEP: 60.0000 mg | ORAL_CAPSULE | Freq: Every day | ORAL | 1 refills | Status: DC

## 2021-06-23 MED ORDER — CLONAZEPAM 0.5 MG PO TABS
0.5000 mg | ORAL_TABLET | Freq: Every day | ORAL | 0 refills | Status: DC | PRN
Start: 2021-06-23 — End: 2021-06-23

## 2021-06-23 MED ORDER — CLONAZEPAM 0.5 MG PO TABS
0.5000 mg | ORAL_TABLET | Freq: Every day | ORAL | 0 refills | Status: DC | PRN
Start: 2021-06-23 — End: 2021-07-01

## 2021-06-23 NOTE — Progress Notes (Signed)
Virtual Visit via Telephone Note  I connected with Brian [name] on 06/23/21 at  3:20 PM EST by telephone and verified that I am speaking with the correct person using two identifiers.  Location: Patient: In Car Provider: Office   I discussed the limitations, risks, security and privacy concerns of performing an evaluation and management service by telephone and the availability of in person appointments. I also discussed with the patient that there may be a patient responsible charge related to this service. The patient expressed understanding and agreed to proceed.   History of Present Illness: Patient is evaluated by phone session.  He is taking Cymbalta and now back on Klonopin.  We tried doxepin but after taking 1 pill he was feeling very groggy.  He recently had a sleep consultation and waiting for sleep study.  He admitted feeling nervous and anxious and occasionally has nightmares.  He has panic attack and the last attack when he was traveling but he feels overall Cymbalta helps.  He is getting married next Saturday.  He is in a relationship for more than 4 years.  He admitted that may be contributing to his anxiety.  Denies any crying spells, feeling of hopelessness or worthlessness.  Lately he is very busy working.  He has no tremors, shakes or any EPS.  He denies any paranoia or any hallucination.    Past Psychiatric History: Reviewed. H/O panic attacks.  Given valium by Dr Estill Batten. Saw therapist at [facility] mental health.  Took Ambien, Xanax and Paxil from urgent [facility] in 2017.  Trazodone, Doxepin did not helped. No h/o inpatient treatment, psychosis, suicidal attempt, abuse, paranoia or self abusive behavior.   Psychiatric Specialty Exam: Physical Exam  Review of Systems  Weight 270 lb (122.5 kg).There is no height or weight on file to calculate BMI.  General Appearance: NA  Eye Contact:  NA  Speech:  Clear and Coherent and Normal Rate  Volume:  Normal   Mood:  Anxious  Affect:  NA  Thought Process:  Goal Directed  Orientation:  Full (Time, Place, and Person)  Thought Content:  Rumination  Suicidal Thoughts:  No  Homicidal Thoughts:  No  Memory:  Immediate;   Good Recent;   Good Remote;   Good  Judgement:  Good  Insight:  Good  Psychomotor Activity:  NA  Concentration:  Concentration: Good and Attention Span: Good  Recall:  Good  Fund of Knowledge:  Good  Language:  Good  Akathisia:  No  Handed:  Right  AIMS (if indicated):     Assets:  Communication Skills Desire for Improvement Housing Resilience Talents/Skills Transportation  ADL's:  Intact  Cognition:  WNL  Sleep:        Assessment and Plan: PTSD.  Panic attacks.  Discontinue doxepin as patient did not like the effects.  Recommend to try buspirone 5 mg up to 2 times a day.  Continue Cymbalta 60 mg daily and [name] to take as needed for panic attack.  Patient started therapy with Marcial Pacas at [facility] and that he feels helping.  Encouraged to continue therapy.  Patient like to have a follow-up in 2 months.  I recommended to call us back if is any question or any concern.  Congratulations given on his upcoming wedding.  Follow Up Instructions:    I discussed the assessment and treatment plan with the patient. The patient was provided an opportunity to ask questions and all were answered.  The patient agreed with the plan and demonstrated an understanding of the instructions.   The patient was advised to call back or seek an in-person evaluation if the symptoms worsen or if the condition fails to improve as anticipated.  I provided 15 minutes of non-face-to-face time during this encounter.   Cleotis Nipper, MD

## 2021-06-30 ENCOUNTER — Telehealth (HOSPITAL_COMMUNITY): Payer: Self-pay | Admitting: *Deleted

## 2021-06-30 DIAGNOSIS — F410 Panic disorder [episodic paroxysmal anxiety] without agoraphobia: Secondary | ICD-10-CM

## 2021-06-30 NOTE — Telephone Encounter (Signed)
Pt called requesting additional [name] as he is going out of the country  for 2 weeks and is leaving on 07/06/21. Pt stated that he usually gets #30 but only received #20 on 06/23/21 after last appointment. Please review and advise. Thanks.

## 2021-07-01 MED ORDER — CLONAZEPAM 0.5 MG PO TABS
0.5000 mg | ORAL_TABLET | Freq: Every day | ORAL | 0 refills | Status: DC | PRN
Start: 2021-07-01 — End: 2021-08-22

## 2021-07-01 NOTE — Telephone Encounter (Signed)
I sent the new prescription but he should not be taking Klonopin every day.  It is only for severe panic attacks.

## 2021-07-20 ENCOUNTER — Other Ambulatory Visit (HOSPITAL_COMMUNITY): Payer: Self-pay | Admitting: Psychiatry

## 2021-07-20 DIAGNOSIS — F410 Panic disorder [episodic paroxysmal anxiety] without agoraphobia: Secondary | ICD-10-CM

## 2021-07-20 DIAGNOSIS — F4310 Post-traumatic stress disorder, unspecified: Secondary | ICD-10-CM

## 2021-07-21 ENCOUNTER — Telehealth (HOSPITAL_COMMUNITY): Payer: Self-pay | Admitting: *Deleted

## 2021-07-21 DIAGNOSIS — F410 Panic disorder [episodic paroxysmal anxiety] without agoraphobia: Secondary | ICD-10-CM

## 2021-07-21 DIAGNOSIS — F4310 Post-traumatic stress disorder, unspecified: Secondary | ICD-10-CM

## 2021-07-21 MED ORDER — BUSPIRONE HCL 15 MG PO TABS
15.0000 mg | ORAL_TABLET | Freq: Two times a day (BID) | ORAL | 1 refills | Status: DC
Start: 2021-07-21 — End: 2021-08-24

## 2021-07-21 NOTE — Telephone Encounter (Signed)
Send new prescription to his pharmacy.

## 2021-07-21 NOTE — Telephone Encounter (Signed)
Pt called regarding trying to refill the Buspar. Writer spoke with PhD at [facility] regarding refill. Last Rx written for #60 and pt is to take 6 tablets bid. Can I change dose to 30 mg tablet bid? Next visit 08/24/21.

## 2021-08-17 ENCOUNTER — Other Ambulatory Visit (HOSPITAL_COMMUNITY): Payer: Self-pay | Admitting: Psychiatry

## 2021-08-17 DIAGNOSIS — F410 Panic disorder [episodic paroxysmal anxiety] without agoraphobia: Secondary | ICD-10-CM

## 2021-08-19 ENCOUNTER — Telehealth (HOSPITAL_COMMUNITY): Payer: Self-pay

## 2021-08-19 DIAGNOSIS — F410 Panic disorder [episodic paroxysmal anxiety] without agoraphobia: Secondary | ICD-10-CM

## 2021-08-19 NOTE — Telephone Encounter (Signed)
Patient called requesting a refill on his Clonazepam 0.5mg . [facility] [phone] in [place]. Please review and advise. Thank you

## 2021-08-20 ENCOUNTER — Other Ambulatory Visit (HOSPITAL_COMMUNITY): Payer: Self-pay | Admitting: Psychiatry

## 2021-08-20 DIAGNOSIS — F410 Panic disorder [episodic paroxysmal anxiety] without agoraphobia: Secondary | ICD-10-CM

## 2021-08-22 MED ORDER — CLONAZEPAM 0.5 MG PO TABS: 0.5000 mg | ORAL_TABLET | Freq: Every day | ORAL | 0 refills | Status: DC | PRN

## 2021-08-22 NOTE — Telephone Encounter (Signed)
Done

## 2021-08-24 ENCOUNTER — Telehealth (HOSPITAL_BASED_OUTPATIENT_CLINIC_OR_DEPARTMENT_OTHER): Payer: Medicaid Other | Admitting: Psychiatry

## 2021-08-24 ENCOUNTER — Encounter (HOSPITAL_COMMUNITY): Payer: Self-pay | Admitting: Psychiatry

## 2021-08-24 ENCOUNTER — Other Ambulatory Visit: Payer: Self-pay

## 2021-08-24 DIAGNOSIS — F410 Panic disorder [episodic paroxysmal anxiety] without agoraphobia: Secondary | ICD-10-CM

## 2021-08-24 DIAGNOSIS — F4310 Post-traumatic stress disorder, unspecified: Secondary | ICD-10-CM

## 2021-08-24 MED ORDER — CLONAZEPAM 0.5 MG PO TABS: 0.5000 mg | ORAL_TABLET | Freq: Every evening | ORAL | 2 refills | Status: DC | PRN

## 2021-08-24 MED ORDER — DULOXETINE HCL 60 MG PO CPEP: 60.0000 mg | ORAL_CAPSULE | Freq: Every day | ORAL | 2 refills | Status: DC

## 2021-08-24 MED ORDER — BUSPIRONE HCL 15 MG PO TABS: 15.0000 mg | ORAL_TABLET | Freq: Two times a day (BID) | ORAL | 2 refills | Status: DC

## 2021-08-24 NOTE — Telephone Encounter (Signed)
Notified patient. Pt picked up medication

## 2021-08-24 NOTE — Progress Notes (Signed)
Virtual Visit via Telephone Note  I connected with Brian [name] on 08/24/21 at  3:00 PM EST by telephone and verified that I am speaking with the correct person using two identifiers.  Location: Patient: Work Provider: Office   I discussed the limitations, risks, security and privacy concerns of performing an evaluation and management service by telephone and the availability of in person appointments. I also discussed with the patient that there may be a patient responsible charge related to this service. The patient expressed understanding and agreed to proceed.   History of Present Illness: Patient is evaluated by phone session.  He is doing well on the BuSpar.  He taking 50 mg in the morning and 15 at nighttime.  He still requires Klonopin almost every night for sleep.  Patient told his relationship with his wife is going well.  He had a trip to [place] with his wife for 2 weeks and he had good time.  He is very busy at work.  Denies any crying spells, suicidal thoughts but occasionally have nightmares and flashback.  He denies any major panic attack.  He is compliant with Cymbalta and denies any tremors, shakes or any EPS.  He had a good Christmas.  He was able to see his family member and is happy there were no issues.  He does cardio when he done from the work and trying to lose weight.  He had lost few pounds.  Patient like to keep his current medication.    Past Psychiatric History:  H/O panic attacks.  Given valium by Dr Estill Batten. Saw therapist at [facility] mental health.  Took Ambien, Xanax and Paxil from urgent [facility] in 2017.  Trazodone, Doxepin did not helped. No h/o inpatient treatment, psychosis, suicidal attempt, abuse, paranoia or self abusive behavior.  Psychiatric Specialty Exam: Physical Exam  Review of Systems  Weight 265 lb (120.2 kg).Body mass index is 35.94 kg/m.  General Appearance: NA  Eye Contact:  NA  Speech:  Clear and Coherent and Normal Rate   Volume:  Normal  Mood:  Euthymic  Affect:  NA  Thought Process:  Goal Directed  Orientation:  Full (Time, Place, and Person)  Thought Content:  WDL  Suicidal Thoughts:  No  Homicidal Thoughts:  No  Memory:  Immediate;   Good Recent;   Good Remote;   Good  Judgement:  Good  Insight:  Good  Psychomotor Activity:  NA  Concentration:  Concentration: Good and Attention Span: Good  Recall:  Good  Fund of Knowledge:  Good  Language:  Good  Akathisia:  No  Handed:  Right  AIMS (if indicated):     Assets:  Communication Skills Desire for Improvement Housing Social Support Talents/Skills Transportation  ADL's:  Intact  Cognition:  WNL  Sleep:   better      Assessment and Plan: PTSD.  Panic attacks.  Patient doing better on BuSpar.  Continue 50 mg twice a day, Cymbalta 60 mg daily and [name] at bedtime.  Discussed medication side effects and benefits.  He is in therapy with Marcial Pacas at [facility].  Recommended to call us back if there is any question or any concern.  Follow-up in 3 months.  Follow Up Instructions:    I discussed the assessment and treatment plan with the patient. The patient was provided an opportunity to ask questions and all were answered. The patient agreed with the plan and demonstrated an understanding of the instructions.  The patient was advised to call back or seek an in-person evaluation if the symptoms worsen or if the condition fails to improve as anticipated.  I provided 19 minutes of non-face-to-face time during this encounter.   Kathlee Nations, MD

## 2021-09-13 ENCOUNTER — Telehealth: Payer: Self-pay

## 2021-09-13 NOTE — Telephone Encounter (Signed)
LVM for pt to call me back to schedule sleep study  

## 2021-09-26 ENCOUNTER — Ambulatory Visit (INDEPENDENT_AMBULATORY_CARE_PROVIDER_SITE_OTHER): Payer: Medicaid Other | Admitting: Neurology

## 2021-09-26 DIAGNOSIS — G4733 Obstructive sleep apnea (adult) (pediatric): Secondary | ICD-10-CM

## 2021-09-26 DIAGNOSIS — G8929 Other chronic pain: Secondary | ICD-10-CM

## 2021-09-26 DIAGNOSIS — R0683 Snoring: Secondary | ICD-10-CM

## 2021-09-26 DIAGNOSIS — M2619 Other specified anomalies of jaw-cranial base relationship: Secondary | ICD-10-CM

## 2021-09-26 DIAGNOSIS — F4310 Post-traumatic stress disorder, unspecified: Secondary | ICD-10-CM

## 2021-09-28 NOTE — Progress Notes (Signed)
°  °  °[facility] °  °HOME SLEEP TEST REPORT ( by Watch PAT)   °STUDY DATE:  09-28-2021 °  °ORDERING CLINICIAN: Carmen Dohmeier, MD  °CLINICIAN: Syed Arfeen, MD °Referring Provider: Stokes, Carolyn, Pa °[address] °[place],  [place] [address]  °  °CLINICAL INFORMATION/HISTORY: 05-23-2021 °Brian [name] is a 30 y.o. African American male patient seen here on 05/23/2021 upon referral for a sleep consultation.  °Chief concern according to patient :  "I have never been tested but I have a suspicion- my wife tells me to turn over when I snore loudly and tells me that I pause in my breathing. She counted up to 15 seconds" .  °  °Brian [name] has a past medical history of PTSD ( in active treatment) nocturnal panic disorder, Osteoarthritis and knee sports injuries, Obesity with BMI 38, and Hypertension. Donor meniscus in the right knee. Gained weight due to lack of physical exercise. Nocturia more than twice, Adenoidectomy but no Tonsillectomy, reports knee pain. ° °  °Epworth sleepiness score: 7/24. °  °BMI: 38 kg/m² °  °Neck Circumference: 20" °  °FINDINGS: °  °Sleep Summary: °  °Total Recording Time (hours, min): Total recording time for this patient was 9 hours and 10 minutes of which 7 hours 38 minutes was a total sleep time calculated.  19% of sleep time were REM sleep.                          °  °Respiratory Indices: °  °Calculated pAHI (per hour): 52.5                         °  °REM pAHI:   59.3                                            °  °NREM pAHI:   51.2                         °  °Positional AHI:   Supine AHI was 16 1/h, prone AHI 35.2 and left-sided sleep position achieved an AHI of 45.7/h. ° °Snoring was associated with a mean volume of 44 dB and accompanied over 50% of sleep time.                                              °  °Oxygen Saturation Statistics: °  °O2 Saturation Range (%): Oxygen saturation varied between a nadir of 84 and a maximum of 99% with a mean value of 94%.                                    °  °O2 Saturation (minutes) <89%:   3.4 minutes      °  °Pulse Rate Statistics: °  °Pulse Range: Varied between 40 to 109 bpm with a mean heart rate of 69 bpm.  Please note that heart rate but not cardiac rhythm can be evaluated by home sleep test.             °  °  IMPRESSION:  This HST confirms the presence of severe sleep apnea, obstructive sleep apnea.  This is also associated with loud snoring.  There was no clinically significant degree of hypoxia and no REM sleep dependence.  Sleep apnea was severe in all sleep positions. ° °RECOMMENDATION: I would recommend CPAP or BiPAP titration for this patient.  With a history of PTSD it may be important to avoid a full facemask and try a nasal cradle or nasal pillow first to help the patient acclimatized to the device.   °I will use an auto titration CPAP with a setting of 3 cm expiratory pressure relief , beginning at 7cm through 20 cmH2O pressure.  I stated clearly that I ordered a ResMed machine with heated humidification.  ° °  °INTERPRETING PHYSICIAN: ° ° Carmen Dohmeier, MD  ° °Medical Director of [facility].   ° ° ° ° ° ° ° ° ° ° ° ° ° ° ° ° °

## 2021-10-07 DIAGNOSIS — G4733 Obstructive sleep apnea (adult) (pediatric): Secondary | ICD-10-CM | POA: Insufficient documentation

## 2021-10-07 DIAGNOSIS — M2619 Other specified anomalies of jaw-cranial base relationship: Secondary | ICD-10-CM | POA: Insufficient documentation

## 2021-10-07 DIAGNOSIS — F4310 Post-traumatic stress disorder, unspecified: Secondary | ICD-10-CM | POA: Insufficient documentation

## 2021-10-07 NOTE — Procedures (Signed)
[facility]   HOME SLEEP TEST REPORT ( by Watch PAT)   STUDY DATE:  09-28-2021   ORDERING CLINICIAN: Melvyn Novas, MD  CLINICIAN: Kathryne Sharper, MD Referring Provider: Lewanda Rife, Pa [address] [place],  [place] [address]    CLINICAL INFORMATION/HISTORY: 05-23-2021 Brian [name] is a 31 y.o. African American male patient seen here on 05/23/2021 upon referral for a sleep consultation.  Chief concern according to patient :  "I have never been tested but I have a suspicion- my wife tells me to turn over when I snore loudly and tells me that I pause in my breathing. She counted up to 15 seconds" .    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 Donor meniscus in the right knee. Gained weight due to lack of physical exercise. Nocturia more than twice, Adenoidectomy but no Tonsillectomy, reports knee pain.    Epworth sleepiness score: 7/24.   BMI: 38 kg/m   Neck Circumference: 20"   FINDINGS:   Sleep Summary:   Total Recording Time (hours, min): Total recording time for this patient was 9 hours and 10 minutes of which 7 hours 38 minutes was a total sleep time calculated.  19% of sleep time were REM sleep.                            Respiratory Indices:   Calculated pAHI (per hour): 52.5                           REM pAHI:   59.3                                              NREM pAHI:   51.2                           Positional AHI:   Supine AHI was 16 1/h, prone AHI 35.2 and left-sided sleep position achieved an AHI of 45.7/h.  Snoring was associated with a mean volume of 44 dB and accompanied over 50% of sleep time.                                                Oxygen Saturation Statistics:   O2 Saturation Range (%): Oxygen saturation varied between a nadir of 84 and a maximum of 99% with a mean value of 94%.                                      O2 Saturation (minutes) <89%:   3.4 minutes        Pulse Rate Statistics:   Pulse Range: Varied between 40 to 109 bpm with a mean heart rate of 69 bpm.  Please note that heart rate but not cardiac rhythm can be evaluated by home sleep test.  IMPRESSION:  This HST confirms the presence of severe sleep apnea, obstructive sleep apnea.  This is also associated with loud snoring.  There was no clinically significant degree of hypoxia and no REM sleep dependence.  Sleep apnea was severe in all sleep positions.  RECOMMENDATION: I would recommend CPAP or BiPAP titration for this patient.  With a history of PTSD it may be important to avoid a full facemask and try a nasal cradle or nasal pillow first to help the patient acclimatized to the device.   I will use an auto titration CPAP with a setting of 3 cm expiratory pressure relief , beginning at 7cm through 20 cmH2O pressure.  I stated clearly that I ordered a ResMed machine with heated humidification.     INTERPRETING PHYSICIAN:   Melvyn Novas, MD   Medical Director of [facility].

## 2021-10-07 NOTE — Addendum Note (Signed)
Addended by: Larey Seat on: 10/07/2021 12:48 PM   Modules accepted: Orders

## 2021-10-10 ENCOUNTER — Telehealth: Payer: Self-pay

## 2021-10-10 NOTE — Telephone Encounter (Signed)
-----   Message from Melvyn Novas, MD sent at 10/07/2021 12:47 PM EST ----- IMPRESSION:  This HST confirms the presence of severe sleep apnea, obstructive sleep apnea.  This is also associated with loud snoring.  There was no clinically significant degree of hypoxia and no REM sleep dependence.  Sleep apnea was severe in all sleep positions.  RECOMMENDATION: I would recommend CPAP or BiPAP titration for this patient.  With a history of PTSD it may be important to avoid a full facemask and try a nasal cradle or nasal pillow first to help the patient acclimatized to the device.   I will use an auto titration CPAP with a setting of 3 cm expiratory pressure relief , beginning at 7cm through 20 cmH2O pressure.  I stated clearly that I ordered a ResMed machine with heated humidification.

## 2021-10-10 NOTE — Telephone Encounter (Signed)
I called pt. I advised pt that Dr. Vickey Huger reviewed their sleep study results and found that pt has severe osa. Dr. Vickey Huger recommends that pt start an auto-pap at home. I reviewed PAP compliance expectations with the pt. Pt is agreeable to starting an auto-PAP. I advised pt that an order will be sent to a DME, [facility], and [facility] will call the pt within about one week after they file with the pt's insurance. [facility] will show the pt how to use the machine, fit for masks, and troubleshoot the auto-PAP if needed. A follow up appt was made for insurance purposes with Dr. Vickey Huger on 01/11/2022 at 3:30pm. Pt verbalized understanding to arrive 15 minutes early and bring their auto-PAP. A letter with all of this information in it will be mailed to the pt as a reminder. I verified with the pt that the address we have on file is correct. Pt verbalized understanding of results. Pt had no questions at this time but was encouraged to call back if questions arise. I have sent the order to [facility] and have received confirmation that they have received the order.

## 2021-11-23 ENCOUNTER — Telehealth (HOSPITAL_COMMUNITY): Payer: Medicaid Other | Admitting: Psychiatry

## 2021-12-06 ENCOUNTER — Telehealth (HOSPITAL_COMMUNITY): Payer: Self-pay | Admitting: *Deleted

## 2021-12-06 NOTE — Telephone Encounter (Signed)
Pt called requesting a refill of the [name]. Script last sent in on 08/24/21, #30, with 2 fills.  08/24/21 was pt last appointment. Pt no showed on 11/23/21 and has not rescheduled. We will call him to set up appointment. Pt states that he is going out of the country from 12/14/21 thru 12/21/21 and will not be able to refill. Pt also states he uses the medication when flying. Please review and advise.  ?

## 2021-12-07 ENCOUNTER — Other Ambulatory Visit (HOSPITAL_COMMUNITY): Payer: Self-pay | Admitting: *Deleted

## 2021-12-07 DIAGNOSIS — F410 Panic disorder [episodic paroxysmal anxiety] without agoraphobia: Secondary | ICD-10-CM

## 2021-12-07 MED ORDER — CLONAZEPAM 0.5 MG PO TABS: 0.5000 mg | ORAL_TABLET | Freq: Every evening | ORAL | 0 refills | Status: DC | PRN

## 2021-12-20 ENCOUNTER — Other Ambulatory Visit (HOSPITAL_COMMUNITY): Payer: Self-pay | Admitting: Psychiatry

## 2021-12-20 DIAGNOSIS — F4310 Post-traumatic stress disorder, unspecified: Secondary | ICD-10-CM

## 2021-12-20 DIAGNOSIS — F410 Panic disorder [episodic paroxysmal anxiety] without agoraphobia: Secondary | ICD-10-CM

## 2021-12-22 ENCOUNTER — Telehealth (HOSPITAL_COMMUNITY): Payer: Self-pay | Admitting: *Deleted

## 2021-12-22 ENCOUNTER — Other Ambulatory Visit (HOSPITAL_COMMUNITY): Payer: Self-pay | Admitting: *Deleted

## 2021-12-22 DIAGNOSIS — F4310 Post-traumatic stress disorder, unspecified: Secondary | ICD-10-CM

## 2021-12-22 DIAGNOSIS — F410 Panic disorder [episodic paroxysmal anxiety] without agoraphobia: Secondary | ICD-10-CM

## 2021-12-22 MED ORDER — BUSPIRONE HCL 15 MG PO TABS: 15.0000 mg | ORAL_TABLET | Freq: Two times a day (BID) | ORAL | 0 refills | Status: DC

## 2021-12-22 MED ORDER — CLONAZEPAM 0.5 MG PO TABS: 0.5000 mg | ORAL_TABLET | Freq: Every evening | ORAL | 0 refills | Status: DC | PRN

## 2021-12-22 NOTE — Telephone Encounter (Signed)
Please refill bridge until his next appointment.  ?

## 2021-12-22 NOTE — Telephone Encounter (Signed)
Pt called requesting refills of the Buspar 15 mg bid last scripted on 08/24/21 #60 with 2 refills, and [name] last filled #5 on 11/17/21. Pt transferred to front desk to make an appointment as pt last visit was a no show and has had previous no shows and cancellations also. Writer encouraged pt to make and keep appointment for future med management. Pt verbalizes understanding. Pt now has an appointment scheduled for 01/24/22. Please review and advise. ?

## 2022-01-10 ENCOUNTER — Other Ambulatory Visit (HOSPITAL_COMMUNITY): Payer: Self-pay | Admitting: Psychiatry

## 2022-01-10 DIAGNOSIS — F410 Panic disorder [episodic paroxysmal anxiety] without agoraphobia: Secondary | ICD-10-CM

## 2022-01-11 ENCOUNTER — Telehealth (HOSPITAL_COMMUNITY): Payer: Self-pay | Admitting: *Deleted

## 2022-01-11 ENCOUNTER — Ambulatory Visit: Payer: Medicaid Other | Admitting: Neurology

## 2022-01-11 NOTE — Telephone Encounter (Signed)
No more bridge information.

## 2022-01-11 NOTE — Telephone Encounter (Signed)
Pt called requesting a refill of the [name], last filled on 12/22/21 for #20. Pt's last visit was on 08/24/21. Pt no showed on last appointment and required several reminders to make that appointment on 11/23/21. Pt has received 3 bridge scripts since last appointment FYI. His next appointment is scheduled for 01/24/22. [facility] uses [facility] [facility]. Please review and advise.

## 2022-01-11 NOTE — Telephone Encounter (Signed)
Yes that's what I mean. No more bridge refills.

## 2022-01-11 NOTE — Telephone Encounter (Signed)
No more bridge scripts?

## 2022-01-11 NOTE — Telephone Encounter (Signed)
Got it.

## 2022-01-13 ENCOUNTER — Telehealth (HOSPITAL_COMMUNITY): Payer: Self-pay | Admitting: *Deleted

## 2022-01-13 DIAGNOSIS — F410 Panic disorder [episodic paroxysmal anxiety] without agoraphobia: Secondary | ICD-10-CM

## 2022-01-13 NOTE — Telephone Encounter (Signed)
Writer spoke with pt regarding Klonopin and not having a bridge or refill until appointment on 01/24/22. Pt is concerned about lack of sleep and states he's tried Trazodone and Melatonin in the past without success. Pt anxious about not sleeping which exacerbates insomnia he says.Clinical research associate offered to send message to covering MD for Vistaril or something similar until he sees you. Pt wanted to nurse to ask you if there is anything that would help in the interim (Remeron?). Writer encouraged pt to try Melatonin low dose or sleep aid with Benadryl and also encouraged to consult with pharmacist while at pharmacy. Pt verbalized understanding.

## 2022-01-16 MED ORDER — CLONAZEPAM 0.5 MG PO TABS: 0.5000 mg | ORAL_TABLET | Freq: Every evening | ORAL | 0 refills | Status: DC | PRN

## 2022-01-16 NOTE — Telephone Encounter (Signed)
Don't want to start new medication. I will give 10 tablets of Klonopin but patient must keep the appointment on June 13.  If patient did not keep the appointment we have to refer him out.

## 2022-01-16 NOTE — Telephone Encounter (Signed)
Pt.notified

## 2022-01-24 ENCOUNTER — Encounter (HOSPITAL_COMMUNITY): Payer: Self-pay | Admitting: Psychiatry

## 2022-01-24 ENCOUNTER — Encounter: Payer: Self-pay | Admitting: Neurology

## 2022-01-24 ENCOUNTER — Telehealth (HOSPITAL_BASED_OUTPATIENT_CLINIC_OR_DEPARTMENT_OTHER): Payer: Medicaid Other | Admitting: Psychiatry

## 2022-01-24 ENCOUNTER — Ambulatory Visit: Payer: Medicaid Other | Admitting: Neurology

## 2022-01-24 DIAGNOSIS — F410 Panic disorder [episodic paroxysmal anxiety] without agoraphobia: Secondary | ICD-10-CM

## 2022-01-24 DIAGNOSIS — F4310 Post-traumatic stress disorder, unspecified: Secondary | ICD-10-CM | POA: Diagnosis not present

## 2022-01-24 MED ORDER — BUSPIRONE HCL 15 MG PO TABS: 15.0000 mg | ORAL_TABLET | Freq: Two times a day (BID) | ORAL | 0 refills | Status: DC

## 2022-01-24 MED ORDER — CLONAZEPAM 0.5 MG PO TABS: 0.5000 mg | ORAL_TABLET | Freq: Every evening | ORAL | 2 refills | Status: DC | PRN

## 2022-01-24 MED ORDER — DULOXETINE HCL 60 MG PO CPEP: 60.0000 mg | ORAL_CAPSULE | Freq: Every day | ORAL | 0 refills | Status: DC

## 2022-01-24 NOTE — Progress Notes (Signed)
Virtual Visit via Telephone Note  I connected with Gigi Gin on 01/24/22 at  8:40 AM EDT by telephone and verified that I am speaking with the correct person using two identifiers.  Location: Patient: [facility]  Provider: Home Office   I discussed the limitations, risks, security and privacy concerns of performing an evaluation and management service by telephone and the availability of in person appointments. I also discussed with the patient that there may be a patient responsible charge related to this service. The patient expressed understanding and agreed to proceed.   History of Present Illness: Patient is evaluated by phone session.  He was last seen 6 months ago.  He admitted missing a few appointments because he was traveling overseas and domestic.  He admitted there are times when he is out of his medication and having withdrawal symptoms.  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  Overall he feels the medicine working very well.  Denies any nightmares, flashback and panic attack when he takes his medicine on time.  He has no tremor or shakes or any EPS.  He admitted not able to do regular exercise and gain weight but like to go back on his regular exercise.  He also trying to keep his therapy appointment with Leodis Liverpool man at least once or twice a month.  He denies any crying spells or any feeling of hopelessness.  His work is busy but manageable.  He reported his family life is going very well.  He is excited about upcoming trip to [place] in [place] in August.  Denies drinking or using any illegal substances.  Past Psychiatric History:  H/O panic attacks.  Given valium by Dr Estill Batten. Saw therapist at [facility].  Took Ambien, Xanax and Paxil from urgent [facility] in 2017.  Trazodone, Doxepin did not helped. No h/o inpatient  treatment, psychosis, suicidal attempt, abuse, paranoia or self abusive behavior.  Psychiatric Specialty Exam: Physical Exam  Review of Systems  Weight 280 lb (127 kg).There is no height or weight on file to calculate BMI.  General Appearance: NA  Eye Contact:  NA  Speech:  Clear and Coherent and Normal Rate  Volume:  Normal  Mood:  Euthymic  Affect:  NA  Thought Process:  Goal Directed  Orientation:  Full (Time, Place, and Person)  Thought Content:  Logical  Suicidal Thoughts:  No  Homicidal Thoughts:  No  Memory:  Immediate;   Good Recent;   Good Remote;   Good  Judgement:  Intact  Insight:  Present  Psychomotor Activity:  NA  Concentration:  Concentration: Good and Attention Span: Good  Recall:  Good  Fund of Knowledge:  Good  Language:  Good  Akathisia:  No  Handed:  Right  AIMS (if indicated):     Assets:  Communication Skills Desire for Improvement Housing Resilience Social Support Talents/Skills Transportation  ADL's:  Intact  Cognition:  WNL  Sleep:   ok      Assessment and Plan: PTSD.  Panic attacks.  Discussed not taking on medicine can cause withdrawals and worsening of symptoms.  Patient like to keep his medications since it is working and he will make sure that not to miss the medication in the future.  He is in therapy with Marcial Pacas at [facility] at least  twice a month.  Continue BuSpar 15 mg 2 times a day, Cymbalta 60 mg daily and [name] at bedtime.  Encourage exercise as he had not been consistent and gain weight.  Recommended to call us back if is any question or any concern.  Follow-up in 3 months.  Follow Up Instructions:    I discussed the assessment and treatment plan with the patient. The patient was provided an opportunity to ask questions and all were answered. The patient agreed with the plan and demonstrated an understanding of the instructions.   The patient was advised to call back or seek an in-person  evaluation if the symptoms worsen or if the condition fails to improve as anticipated.  Collaboration of Care: Primary Care Provider AEB notes are available in epic to review.  Patient/Guardian was advised Release of Information must be obtained prior to any record release in order to collaborate their care with an outside provider. Patient/Guardian was advised if they have not already done so to contact the registration department to sign all necessary forms in order for [place] to release information regarding their care.   Consent: Patient/Guardian gives verbal consent for treatment and assignment of benefits for services provided during this visit. Patient/Guardian expressed understanding and agreed to proceed.    I provided 21 minutes of non-face-to-face time during this encounter.   Kathlee Nations, MD

## 2022-03-09 NOTE — Progress Notes (Deleted)
[facility] [address] [place]. [place] [address] [phone]       OFFICE FOLLOW UP NOTE  Mr. Brian [name] Date of Birth:  1991-05-03 Medical Record Number:  [id number]   Reason for visit: Initial CPAP follow-up    SUBJECTIVE:   CHIEF COMPLAINT:  No chief complaint on file.   HPI:   Update 03/13/2022 JM: Patient returns for initial CPAP compliance visit.  Completed HST 3/13 which showed severe OSA with total AHI of 52.5/h.  Recommended initiation of autoPAP which was started on 5/9.        Consult visit 05/23/2021 Dr. Vickey Huger: Brian [name] is a 31 y.o. African American male patient and seen here as a referral on 05/23/2021 from PCP  for a sleep consultation..  Chief concern according to patient :  I have never been tested but I have a suspicion, my wife tells me to turn over when I snore loudly and I pause in my breathing. She counted up to 15 seconds .    I have the pleasure of seeing Brian [name] today, a right -handed patient with a possible sleep disorder.  He has a past medical history of Anxiety, Osteoarthritis and knee sports injuries, BMI 38,adult, and Hypertension. Donor meniscus in the right knee. Gained weight due to lack of physical exercise.    Sleep relevant medical history: Nocturia more than twice, Adenoidectomy  but no Tonsillectomy, knee pain.  Family medical /sleep history: No other family member on CPAP with OSA.  Social history:  Patient is working as a Network engineer, Psychologist, sport and exercise, and lives in a household with spouse and one son- The patient currently works 7 days  week. Tobacco use; snuff.  ETOH use ; rare ,  Caffeine intake in form of Coffee( /) Soda( /) Tea ( /) or energy drinks. Regular exercise in form of work.         Sleep habits are as follows: The patient's dinner time is between 7 PM. The patient goes to bed at 12 PM and continues to sleep for intervals of 1-2 hours, wakes up coughing,  sometimes,  knee pain,  bathroom breaks, the first time at 1 AM.   The preferred sleep position is sideways, with the support of 1 pillow on a flat bed. Dreams are reportedly rare/ infrequent.  8  AM is the usual rise time. The patient wakes up spontaneously.  He reports not feeling refreshed or restored in AM, with symptoms such as dry mouth, morning headaches, and residual fatigue.  Naps are taken infrequently, reports he can't nap.      ROS:   14 system review of systems performed and negative with exception of ***  PMH:  Past Medical History:  Diagnosis Date   Anxiety    BMI 34.0-34.9,adult    Hypertension     PSH:  Past Surgical History:  Procedure Laterality Date   ANTERIOR CRUCIATE LIGAMENT REPAIR     BACK SURGERY      Social History:  Social History   Socioeconomic History   Marital status: Significant Other    Spouse name: colleen   Number of children: 1   Years of education: Not on file   Highest education level: Some college, no degree  Occupational History   Not on file  Tobacco Use   Smoking status: Never   Smokeless tobacco: Current    Types: Snuff  Vaping Use   Vaping Use: Never used  Substance  and Sexual Activity   Alcohol use: Yes    Alcohol/week: 0.0 standard drinks of alcohol    Comment: occ   Drug use: No   Sexual activity: Yes    Birth control/protection: Condom, Pill  Other Topics Concern   Not on file  Social History Narrative   Lives with fiance   Right handed   Caffeine: very seldom   Social Determinants of Corporate investment banker Strain: Not on file  Food Insecurity: Not on file  Transportation Needs: Not on file  Physical Activity: Not on file  Stress: Not on file  Social Connections: Not on file  Intimate Partner Violence: Not on file    Family History:  Family History  Problem Relation Age of Onset   Diabetes type II Mother    High Cholesterol Father    High blood pressure Father    DiGeorge syndrome Sister     High blood pressure Maternal Grandmother    Kidney disease Maternal Grandmother    Cancer Maternal Grandmother    Cancer Maternal Grandfather    Cancer Paternal Grandmother    High blood pressure Paternal Grandfather    Cancer Paternal Grandfather    Colon cancer Paternal Grandfather     Medications:   Current Outpatient Medications on File Prior to Visit  Medication Sig Dispense Refill   albuterol (VENTOLIN HFA) 108 (90 Base) MCG/ACT inhaler Inhale 1-2 puffs into the lungs every 4 (four) hours as needed for wheezing or shortness of breath. 1 each 0   busPIRone (BUSPAR) 15 MG tablet Take 1 tablet (15 mg total) by mouth in the morning and at bedtime. 180 tablet 0   clonazePAM (KLONOPIN) 0.5 MG tablet Take 1 tablet (0.5 mg total) by mouth at bedtime as needed for anxiety (panic attacks). 30 tablet 2   DULoxetine (CYMBALTA) 60 MG capsule Take 1 capsule (60 mg total) by mouth daily. 90 capsule 0   ibuprofen (ADVIL) 600 MG tablet Take 1 tablet (600 mg total) by mouth every 6 (six) hours as needed. 30 tablet 0   valsartan-hydrochlorothiazide (DIOVAN-HCT) 80-12.5 MG tablet Take 1 tablet by mouth daily.     No current facility-administered medications on file prior to visit.    Allergies:   Allergies  Allergen Reactions   Reglan [Metoclopramide] Other (See Comments)    Sweating and hot sensation in jaws an genital area       OBJECTIVE:  Physical Exam  There were no vitals filed for this visit. There is no height or weight on file to calculate BMI. No results found.   General: well developed, well nourished, seated, in no evident distress Head: head normocephalic and atraumatic.   Neck: supple with no carotid or supraclavicular bruits Cardiovascular: regular rate and rhythm, no murmurs Musculoskeletal: no deformity Skin:  no rash/petichiae Vascular:  Normal pulses all extremities   Neurologic Exam Mental Status: Awake and fully alert. Oriented to place and time. Recent and  remote memory intact. Attention span, concentration and fund of knowledge appropriate. Mood and affect appropriate.  Cranial Nerves: Pupils equal, briskly reactive to light. Extraocular movements full without nystagmus. Visual fields full to confrontation. Hearing intact. Facial sensation intact. Face, tongue, palate moves normally and symmetrically.  Motor: Normal bulk and tone. Normal strength in all tested extremity muscles Sensory.: intact to touch , pinprick , position and vibratory sensation.  Coordination: Rapid alternating movements normal in all extremities. Finger-to-nose and heel-to-shin performed accurately bilaterally. Gait and Station: Arises from chair without difficulty.  Stance is normal. Gait demonstrates normal stride length and balance without use of AD. Tandem walk and heel toe without difficulty.  Reflexes: 1+ and symmetric. Toes downgoing.         ASSESSMENT: Brian [name] is a 31 y.o. year old male with recent diagnosis of severe sleep apnea in 09/2021 and initiation of CPAP 12/2021.      PLAN:  OSA on CPAP : Compliance report shows satisfactory usage with optimal residual AHI.  Discussed continued nightly usage with ensuring greater than 4 hours nightly for optimal benefit and per insurance purposes.  Continue to follow with DME company for any needed supplies or CPAP related concerns     Follow up in *** or call earlier if needed   CC:  PCP: Renaye Rakers, MD    I spent *** minutes of face-to-face and non-face-to-face time with patient.  This included previsit chart review, lab review, study review, order entry, electronic health record documentation, patient education regarding diagnosis of sleep apnea with review and discussion of compliance report and answered all other questions to patient's satisfaction   Ihor Austin, [facility]  [facility] [address] [place], [place] [address]  Phone [phone] Fax  [phone] Note: This document was prepared with digital dictation and possible smart phrase technology. Any transcriptional errors that result from this process are unintentional.

## 2022-03-13 ENCOUNTER — Telehealth: Payer: Self-pay | Admitting: Neurology

## 2022-03-13 ENCOUNTER — Encounter: Payer: Medicaid Other | Admitting: Adult Health

## 2022-03-13 NOTE — Telephone Encounter (Signed)
Pt cancelled appt due to running late from another physician's appt

## 2022-03-14 ENCOUNTER — Encounter: Payer: Self-pay | Admitting: Adult Health

## 2022-03-14 ENCOUNTER — Ambulatory Visit: Payer: Medicaid Other | Admitting: Adult Health

## 2022-03-14 VITALS — BP 136/80 | HR 74 | Ht 72.0 in | Wt 295.0 lb

## 2022-03-14 DIAGNOSIS — G4733 Obstructive sleep apnea (adult) (pediatric): Secondary | ICD-10-CM

## 2022-03-14 DIAGNOSIS — Z9989 Dependence on other enabling machines and devices: Secondary | ICD-10-CM | POA: Diagnosis not present

## 2022-03-14 NOTE — Progress Notes (Signed)
[facility] [address] [place]. [place] [address] [phone]       OFFICE FOLLOW UP NOTE  Mr. Brian [name] Date of Birth:  [birth year] Medical Record Number:  [id number]   Reason for visit: Initial CPAP follow-up    SUBJECTIVE:   CHIEF COMPLAINT:  Chief Complaint  Patient presents with   Obstructive Sleep Apnea    RM 3 alone  Pt is well and stable, no concerns with CPAP     HPI:   Update 03/13/2022 JM: Patient returns for initial CPAP compliance visit.  Completed HST 3/13 which showed severe OSA with total AHI of 52.5/h.  Recommended initiation of autoPAP which was started on 5/9.   Review of compliance report as below showing excellent usage and optimal residual AHI.  Reports tolerating CPAP well without any difficulty. Current use of nasal mask, does note after a couple weeks of use, the silicone will start to become softer and will notice more leaking.  He did try fullface mask but had difficulty tolerating.  Notes for improvement of daytime fatigue, energy levels and improved sleep.  Snoring has greatly diminished.  Epworth Sleepiness Scale 2/24 (prior to therapy 7/24).  Fatigue severity scale 36/63 (prior to therapy 55/63).           Consult visit 05/23/2021 Dr. Vickey Huger: JONTAE [name] is a 31 y.o. African American male patient and seen here as a referral on 05/23/2021 from PCP  for a sleep consultation..  Chief concern according to patient :  I have never been tested but I have a suspicion, my wife tells me to turn over when I snore loudly and I pause in my breathing. She counted up to 15 seconds .    I have the pleasure of seeing Brian [name] today, a right -handed patient with a possible sleep disorder.  He has a past medical history of Anxiety, Osteoarthritis and knee sports injuries, BMI 38,adult, and Hypertension. Donor meniscus in the right knee. Gained weight due to lack of physical exercise.    Sleep relevant medical history: Nocturia  more than twice, Adenoidectomy  but no Tonsillectomy, knee pain.  Family medical /sleep history: No other family member on CPAP with OSA.  Social history:  Patient is working as a Network engineer, Psychologist, sport and exercise, and lives in a household with spouse and one son- The patient currently works 7 days  week. Tobacco use; snuff.  ETOH use ; rare ,  Caffeine intake in form of Coffee( /) Soda( /) Tea ( /) or energy drinks. Regular exercise in form of work.         Sleep habits are as follows: The patient's dinner time is between 7 PM. The patient goes to bed at 12 PM and continues to sleep for intervals of 1-2 hours, wakes up coughing, sometimes,  knee pain,  bathroom breaks, the first time at 1 AM.   The preferred sleep position is sideways, with the support of 1 pillow on a flat bed. Dreams are reportedly rare/ infrequent.  8  AM is the usual rise time. The patient wakes up spontaneously.  He reports not feeling refreshed or restored in AM, with symptoms such as dry mouth, morning headaches, and residual fatigue.  Naps are taken infrequently, reports he can't nap.      ROS:   14 system review of systems performed and negative with exception of those listed in HPI  PMH:  Past Medical History:  Diagnosis Date  Anxiety    BMI 34.0-34.9,adult    Hypertension     PSH:  Past Surgical History:  Procedure Laterality Date   ANTERIOR CRUCIATE LIGAMENT REPAIR     BACK SURGERY      Social History:  Social History   Socioeconomic History   Marital status: Significant Other    Spouse name: colleen   Number of children: 1   Years of education: Not on file   Highest education level: Some college, no degree  Occupational History   Not on file  Tobacco Use   Smoking status: Never   Smokeless tobacco: Current    Types: Snuff  Vaping Use   Vaping Use: Never used  Substance and Sexual Activity   Alcohol use: Yes    Alcohol/week: 0.0 standard drinks of alcohol     Comment: occ   Drug use: No   Sexual activity: Yes    Birth control/protection: Condom, Pill  Other Topics Concern   Not on file  Social History Narrative   Lives with fiance   Right handed   Caffeine: very seldom   Social Determinants of Corporate investment banker Strain: Not on file  Food Insecurity: Not on file  Transportation Needs: Not on file  Physical Activity: Not on file  Stress: Not on file  Social Connections: Not on file  Intimate Partner Violence: Not on file    Family History:  Family History  Problem Relation Age of Onset   Diabetes type II Mother    High Cholesterol Father    High blood pressure Father    DiGeorge syndrome Sister    High blood pressure Maternal Grandmother    Kidney disease Maternal Grandmother    Cancer Maternal Grandmother    Cancer Maternal Grandfather    Cancer Paternal Grandmother    High blood pressure Paternal Grandfather    Cancer Paternal Grandfather    Colon cancer Paternal Grandfather     Medications:   Current Outpatient Medications on File Prior to Visit  Medication Sig Dispense Refill   busPIRone (BUSPAR) 15 MG tablet Take 1 tablet (15 mg total) by mouth in the morning and at bedtime. 180 tablet 0   clonazePAM (KLONOPIN) 0.5 MG tablet Take 1 tablet (0.5 mg total) by mouth at bedtime as needed for anxiety (panic attacks). 30 tablet 2   DULoxetine (CYMBALTA) 60 MG capsule Take 1 capsule (60 mg total) by mouth daily. 90 capsule 0   ibuprofen (ADVIL) 600 MG tablet Take 1 tablet (600 mg total) by mouth every 6 (six) hours as needed. 30 tablet 0   valsartan-hydrochlorothiazide (DIOVAN-HCT) 80-12.5 MG tablet Take 1 tablet by mouth daily.     No current facility-administered medications on file prior to visit.    Allergies:   Allergies  Allergen Reactions   Reglan [Metoclopramide] Other (See Comments)    Sweating and hot sensation in jaws an genital area       OBJECTIVE:  Physical Exam  Vitals:   03/14/22 1547   BP: 136/80  Pulse: 74  Weight: 295 lb (133.8 kg)  Height: 6' (1.829 m)   Body mass index is 40.01 kg/m. No results found.   General: well developed, well nourished, very pleasant young African-American male, seated, in no evident distress Head: head normocephalic and atraumatic.  Mallampati 2.  Neck: supple with no carotid or supraclavicular bruits Cardiovascular: regular rate and rhythm, no murmurs Musculoskeletal: no deformity Skin:  no rash/petichiae Vascular:  Normal pulses all extremities  Neurologic Exam Mental Status: Awake and fully alert. Oriented to place and time. Recent and remote memory intact. Attention span, concentration and fund of knowledge appropriate. Mood and affect appropriate.  Cranial Nerves: Pupils equal, briskly reactive to light. Extraocular movements full without nystagmus. Visual fields full to confrontation. Hearing intact. Facial sensation intact. Face, tongue, palate moves normally and symmetrically.  Motor: Normal bulk and tone. Normal strength in all tested extremity muscles Sensory.: intact to touch , pinprick , position and vibratory sensation.  Coordination: Rapid alternating movements normal in all extremities. Finger-to-nose and heel-to-shin performed accurately bilaterally. Gait and Station: Arises from chair without difficulty. Stance is normal. Gait demonstrates normal stride length and balance without use of AD. Tandem walk and heel toe without difficulty.  Reflexes: 1+ and symmetric. Toes downgoing.         ASSESSMENT: TAMMIE ELLSWORTH is a 31 y.o. year old male with recent diagnosis of severe sleep apnea in 09/2021 and initiation of CPAP 12/2021.      PLAN:  OSA on CPAP : Compliance report shows satisfactory usage with optimal residual AHI.  Discussed continued nightly usage with ensuring greater than 4 hours nightly for optimal benefit and per insurance purposes.  Continue to follow with DME company for any needed supplies or CPAP  related concerns    Follow up in 6 months or call earlier if needed   CC:  PCP: Renaye Rakers, MD    I spent 24 minutes of face-to-face and non-face-to-face time with patient.  This included previsit chart review, lab review, study review, order entry, electronic health record documentation, patient education regarding diagnosis of sleep apnea with review and discussion of compliance report and answered all other questions to patient's satisfaction   Ihor Austin, [facility]  [facility] [address] [place], [place] [address]  Phone [phone] Fax [phone] Note: This document was prepared with digital dictation and possible smart phrase technology. Any transcriptional errors that result from this process are unintentional.

## 2022-03-26 ENCOUNTER — Other Ambulatory Visit (HOSPITAL_COMMUNITY): Payer: Self-pay | Admitting: Psychiatry

## 2022-03-26 DIAGNOSIS — F410 Panic disorder [episodic paroxysmal anxiety] without agoraphobia: Secondary | ICD-10-CM

## 2022-04-25 ENCOUNTER — Telehealth (HOSPITAL_BASED_OUTPATIENT_CLINIC_OR_DEPARTMENT_OTHER): Payer: 59 | Admitting: Psychiatry

## 2022-04-25 ENCOUNTER — Encounter (HOSPITAL_COMMUNITY): Payer: Self-pay | Admitting: Psychiatry

## 2022-04-25 DIAGNOSIS — F410 Panic disorder [episodic paroxysmal anxiety] without agoraphobia: Secondary | ICD-10-CM

## 2022-04-25 DIAGNOSIS — F4310 Post-traumatic stress disorder, unspecified: Secondary | ICD-10-CM

## 2022-04-25 MED ORDER — BUSPIRONE HCL 15 MG PO TABS: 15.0000 mg | ORAL_TABLET | Freq: Three times a day (TID) | ORAL | 0 refills | Status: DC

## 2022-04-25 MED ORDER — CLONAZEPAM 0.5 MG PO TABS: 0.5000 mg | ORAL_TABLET | Freq: Every evening | ORAL | 2 refills | Status: DC | PRN

## 2022-04-25 MED ORDER — DULOXETINE HCL 60 MG PO CPEP: 60.0000 mg | ORAL_CAPSULE | Freq: Every day | ORAL | 0 refills | Status: DC

## 2022-04-25 NOTE — Progress Notes (Signed)
Virtual Visit via Telephone Note  I connected with Brian [name] on 04/25/22 at 10:00 AM EDT by telephone and verified that I am speaking with the correct person using two identifiers.  Location: Patient: Home Provider: Home Office   I discussed the limitations, risks, security and privacy concerns of performing an evaluation and management service by telephone and the availability of in person appointments. I also discussed with the patient that there may be a patient responsible charge related to this service. The patient expressed understanding and agreed to proceed.   History of Present Illness: Patient is evaluated by phone session.  He is taking BuSpar, Klonopin and Cymbalta.  Recently he had a fall and injured his leg and knee joint while he was at work.  Patient told he was on a ladder working on air conditioner and he slipped and he jumped and landed on his leg.  He is out of work but had appointment coming up end of this month for reevaluation.  He admitted since then his nightmares are more frequently.  He sleeps on and off.  He also feels anxious and nervous.  He gained weight 10 pounds and not sure why because he is watching his calorie intake but admitted due to pain not exercising and as active.  Patient reported his wife is now working dayshift and they have time together in the evening which is going well.  He denies any crying spells but admitted some time stress, irritability and some mood swings.  He denies any suicidal thoughts.  He is in therapy with Benna Dunks once a week.  Denies any tremors, shakes, paranoia, hallucination.  Recently he has seen his PCP Johny Shears at [facility] located in [place].  He was told his thyroid labs are abnormal and now he had appointment to discuss if he need to take the medication.  He reported his family life is going well.  He denies drinking or using any illegal substances.  Past Psychiatric History:  H/O panic attacks.   Given valium by Dr Corky Sox. Saw therapist at [facility].  [facility]  Trazodone, Doxepin did not helped. No h/o inpatient treatment, psychosis, suicidal attempt, abuse, paranoia or self abusive behavior.   Psychiatric Specialty Exam: Physical Exam  Review of Systems  Musculoskeletal:        Knee and leg pain.    Weight 290 lb (131.5 kg).There is no height or weight on file to calculate BMI.  General Appearance: NA  Eye Contact:  NA  Speech:  Normal Rate  Volume:  Normal  Mood:  Anxious  Affect:  NA  Thought Process:  Goal Directed  Orientation:  Full (Time, Place, and Person)  Thought Content:  Rumination  Suicidal Thoughts:  No  Homicidal Thoughts:  No  Memory:  Immediate;   Good Recent;   Good Remote;   Fair  Judgement:  Intact  Insight:  Present  Psychomotor Activity:  NA  Concentration:  Concentration: Good and Attention Span: Good  Recall:  Good  Fund of Knowledge:  Good  Language:  Good  Akathisia:  No  Handed:  Right  AIMS (if indicated):     Assets:  Communication Skills Desire for Improvement Housing Social Support Talents/Skills Transportation  ADL's:  Intact  Cognition:  WNL  Sleep:   fair      Assessment and Plan: PTSD.  Panic attacks.  Discussed recent leg injury and  knee pain that may be contributing to not being active and gain weight.  He is also started to have increased anxiety and nightmares after the injury.  He reported BuSpar was helping but lately it is not as effective.  I recommend to try BuSpar increased from 15 mg 2 times a day to 3 times a day.  Continue Cymbalta 60 mg daily and [name] at bedtime.  Patient will talk to his PCP about his thyroid since his labs are abnormal and may need to start medication.  I recommend that could be contributing factor for his weight gain.  Patient agreed with the treatment plan.  I recommend to call us back if symptoms do  not improve.  We will follow-up in 3 months.  He will continue therapy with Benna Dunks.  Follow Up Instructions:    I discussed the assessment and treatment plan with the patient. The patient was provided an opportunity to ask questions and all were answered. The patient agreed with the plan and demonstrated an understanding of the instructions.   The patient was advised to call back or seek an in-person evaluation if the symptoms worsen or if the condition fails to improve as anticipated.  Collaboration of Care: Other provider involved in patient's care AEB notes are available in epic to review.  Patient/Guardian was advised Release of Information must be obtained prior to any record release in order to collaborate their care with an outside provider. Patient/Guardian was advised if they have not already done so to contact the registration department to sign all necessary forms in order for [place] to release information regarding their care.   Consent: Patient/Guardian gives verbal consent for treatment and assignment of benefits for services provided during this visit. Patient/Guardian expressed understanding and agreed to proceed.    I provided 23 minutes of non-face-to-face time during this encounter.   Cleotis Nipper, MD

## 2022-06-30 ENCOUNTER — Other Ambulatory Visit (HOSPITAL_COMMUNITY): Payer: Self-pay | Admitting: Psychiatry

## 2022-06-30 DIAGNOSIS — F4310 Post-traumatic stress disorder, unspecified: Secondary | ICD-10-CM

## 2022-06-30 DIAGNOSIS — F410 Panic disorder [episodic paroxysmal anxiety] without agoraphobia: Secondary | ICD-10-CM

## 2022-07-20 ENCOUNTER — Other Ambulatory Visit (HOSPITAL_COMMUNITY): Payer: Self-pay | Admitting: Psychiatry

## 2022-07-20 DIAGNOSIS — F410 Panic disorder [episodic paroxysmal anxiety] without agoraphobia: Secondary | ICD-10-CM

## 2022-07-25 ENCOUNTER — Telehealth (HOSPITAL_BASED_OUTPATIENT_CLINIC_OR_DEPARTMENT_OTHER): Payer: 59 | Admitting: Psychiatry

## 2022-07-25 ENCOUNTER — Encounter (HOSPITAL_COMMUNITY): Payer: Self-pay | Admitting: Psychiatry

## 2022-07-25 DIAGNOSIS — F4310 Post-traumatic stress disorder, unspecified: Secondary | ICD-10-CM | POA: Diagnosis not present

## 2022-07-25 DIAGNOSIS — F410 Panic disorder [episodic paroxysmal anxiety] without agoraphobia: Secondary | ICD-10-CM

## 2022-07-25 MED ORDER — CLONAZEPAM 0.5 MG PO TABS: 0.5000 mg | ORAL_TABLET | Freq: Every evening | ORAL | 2 refills | Status: DC | PRN

## 2022-07-25 MED ORDER — DULOXETINE HCL 60 MG PO CPEP: 60.0000 mg | ORAL_CAPSULE | Freq: Every day | ORAL | 0 refills | Status: DC

## 2022-07-25 MED ORDER — BUSPIRONE HCL 15 MG PO TABS: 15.0000 mg | ORAL_TABLET | Freq: Two times a day (BID) | ORAL | 0 refills | Status: DC

## 2022-07-25 NOTE — Progress Notes (Signed)
Virtual Visit via Telephone Note  I connected with Brian [name] on 07/25/22 at 10:00 AM EST by telephone and verified that I am speaking with the correct person using two identifiers.  Location: Patient: Work Provider: Economist   I discussed the limitations, risks, security and privacy concerns of performing an evaluation and management service by telephone and the availability of in person appointments. I also discussed with the patient that there may be a patient responsible charge related to this service. The patient expressed understanding and agreed to proceed.   History of Present Illness: Patient is evaluated by phone session.  He has been doing well since he had a surgery for his knee on October 12.  He is working full-time.  He has fewer panic attack especially when he travels [place] on the plane.  We have tried increased BuSpar 3 times a day but he could not handle higher dose and complaining of brain zapping.  He is back to 2 a day.  He reported had a good Thanksgiving and he is excited because he is going to see the kids on Christmas.  He is sleeping good.  He denies any worsening of nightmares or flashbacks.  His job requires a lot of traveling.  Recently had a visit with his PCP at [facility] and had blood work.  Her thyroid is normal.  His hemoglobin A1c 5.9.  Patient denies any tremors or shakes.  He has not started workout but watching his calorie intake.  His weight is unchanged from the past.  Denies drinking or using any illegal substances.  Past Psychiatric History:  H/O panic attacks.  Given valium by Dr Corky Sox. Saw therapist at [facility].  [facility]  Trazodone, Doxepin did not helped. No h/o inpatient treatment, psychosis, suicidal attempt, abuse, paranoia or self abusive behavior.   Psychiatric Specialty Exam: Physical Exam  Review of Systems  Weight 290 lb (131.5 kg).There is no height or  weight on file to calculate BMI.  General Appearance: NA  Eye Contact:  NA  Speech:  Slow  Volume:  Normal  Mood:  Euthymic  Affect:  NA  Thought Process:  Goal Directed  Orientation:  Full (Time, Place, and Person)  Thought Content:  Logical  Suicidal Thoughts:  No  Homicidal Thoughts:  No  Memory:  Immediate;   Good Recent;   Good Remote;   Good  Judgement:  Intact  Insight:  Present  Psychomotor Activity:  NA  Concentration:  Concentration: Good and Attention Span: Good  Recall:  Good  Fund of Knowledge:  Good  Language:  Good  Akathisia:  No  Handed:  Right  AIMS (if indicated):     Assets:  Communication Skills Desire for Improvement Housing Resilience Social Support Transportation  ADL's:  Intact  Cognition:  WNL  Sleep:   better      Assessment and Plan: PTSD.  Panic attacks.  Patient doing better.  He could not handle higher dose of BuSpar.  He is back on 50 mg 2 times a day.  Continue Cymbalta 60 mg daily [name] at bedtime.  Recommended to call us back if is any question or any concern.  I review blood work results.  Labs are stable.  Follow-up in 3 months  Follow Up Instructions:    I discussed the assessment and treatment plan with the patient. The patient was provided an opportunity to ask questions  and all were answered. The patient agreed with the plan and demonstrated an understanding of the instructions.   The patient was advised to call back or seek an in-person evaluation if the symptoms worsen or if the condition fails to improve as anticipated.  I provided 20 minutes of non-face-to-face time during this encounter.   Kathlee Nations, MD

## 2022-08-24 ENCOUNTER — Other Ambulatory Visit: Payer: Self-pay | Admitting: Nurse Practitioner

## 2022-08-24 DIAGNOSIS — R1011 Right upper quadrant pain: Secondary | ICD-10-CM

## 2022-09-07 NOTE — Progress Notes (Deleted)
[facility] [address] [place]. [place] [address] [phone]       OFFICE FOLLOW UP NOTE  Brian [name] Date of Birth:  1990-10-05 Medical Record Number:  [id number]   Reason for visit: CPAP follow-up    SUBJECTIVE:   CHIEF COMPLAINT:  No chief complaint on file.   HPI:   Prior visit 03/13/2022 for initial CPAP compliance visit.  Doing well on CPAP at that time showing adequate compliance and optimal residual AHI.  Continued on same pressure settings.  Returns for 61-monthCPAP follow-up.    Epworth Sleepiness Scale ***/24 (prior 2/24)            Consult visit 05/23/2021 Dr. DBrett Fairy Brian VANOUSis a 32y.o. African American male patient and seen here as a referral on 05/23/2021 from PCP  for a sleep consultation..  Chief concern according to patient :  I have never been tested but I have a suspicion, my wife tells me to turn over when I snore loudly and I pause in my breathing. She counted up to 15 seconds .    I have the pleasure of seeing Brian BRUNGARDTtoday, a right -handed patient with a possible sleep disorder.  He has a past medical history of Anxiety, Osteoarthritis and knee sports injuries, BMI 38,adult, and Hypertension. Donor meniscus in the right knee. Gained weight due to lack of physical exercise.    Sleep relevant medical history: Nocturia more than twice, Adenoidectomy  but no Tonsillectomy, knee pain.  Family medical /sleep history: No other family member on CPAP with OSA.  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 Tobacco use; snuff.  ETOH use ; rare ,  Caffeine intake in form of Coffee( /) Soda( /) Tea ( /) or energy drinks. Regular exercise in form of work.         Sleep habits are as follows: The patient's dinner time is between 7 PM. The patient goes to bed at 12 PM  and continues to sleep for intervals of 1-2 hours, wakes up coughing, sometimes,  knee pain,  bathroom breaks, the first time at 1 AM.   The preferred sleep position is sideways, with the support of 1 pillow on a flat bed. Dreams are reportedly rare/ infrequent.  8  AM is the usual rise time. The patient wakes up spontaneously.  He reports not feeling refreshed or restored in AM, with symptoms such as dry mouth, morning headaches, and residual fatigue.  Naps are taken infrequently, reports he can't nap.      ROS:   14 system review of systems performed and negative with exception of those listed in HPI  PMH:  Past Medical History:  Diagnosis Date   Anxiety    BMI 34.0-34.9,adult    Hypertension     PSH:  Past Surgical History:  Procedure Laterality Date   ANTERIOR CRUCIATE LIGAMENT REPAIR     BACK SURGERY      Social History:  Social History   Socioeconomic History   Marital status: Significant Other    Spouse name: colleen   Number of children: 1   Years of education: Not on file   Highest education level: Some college, no degree  Occupational History   Not on file  Tobacco Use   Smoking status: Never   Smokeless tobacco:  Current    Types: Snuff  Vaping Use   Vaping Use: Never used  Substance and Sexual Activity   Alcohol use: Yes    Alcohol/week: 0.0 standard drinks of alcohol    Comment: occ   Drug use: No   Sexual activity: Yes    Birth control/protection: Condom, Pill  Other Topics Concern   Not on file  Social History Narrative   Lives with fiance   Right handed   Caffeine: very seldom   Social Determinants of Radio broadcast assistant Strain: Not on file  Food Insecurity: Not on file  Transportation Needs: Not on file  Physical Activity: Not on file  Stress: Not on file  Social Connections: Not on file  Intimate Partner Violence: Not on file    Family History:  Family History  Problem Relation Age of Onset   Diabetes type II Mother     High Cholesterol Father    High blood pressure Father    DiGeorge syndrome Sister    High blood pressure Maternal Grandmother    Kidney disease Maternal Grandmother    Cancer Maternal Grandmother    Cancer Maternal Grandfather    Cancer Paternal Grandmother    High blood pressure Paternal Grandfather    Cancer Paternal Grandfather    Colon cancer Paternal Grandfather     Medications:   Current Outpatient Medications on File Prior to Visit  Medication Sig Dispense Refill   busPIRone (BUSPAR) 15 MG tablet Take 1 tablet (15 mg total) by mouth 2 (two) times daily. 180 tablet 0   clonazePAM (KLONOPIN) 0.5 MG tablet Take 1 tablet (0.5 mg total) by mouth at bedtime as needed for anxiety (panic attacks). 30 tablet 2   DULoxetine (CYMBALTA) 60 MG capsule Take 1 capsule (60 mg total) by mouth daily. 90 capsule 0   ibuprofen (ADVIL) 600 MG tablet Take 1 tablet (600 mg total) by mouth every 6 (six) hours as needed. 30 tablet 0   valsartan-hydrochlorothiazide (DIOVAN-HCT) 80-12.5 MG tablet Take 1 tablet by mouth daily.     No current facility-administered medications on file prior to visit.    Allergies:   Allergies  Allergen Reactions   Reglan [Metoclopramide] Other (See Comments)    Sweating and hot sensation in jaws an genital area       OBJECTIVE:  Physical Exam  There were no vitals filed for this visit.  There is no height or weight on file to calculate BMI. No results found.   General: well developed, well nourished, very pleasant young African-American male, seated, in no evident distress Head: head normocephalic and atraumatic.  Mallampati 2.  Neck: supple with no carotid or supraclavicular bruits Cardiovascular: regular rate and rhythm, no murmurs Musculoskeletal: no deformity Skin:  no rash/petichiae Vascular:  Normal pulses all extremities   Neurologic Exam Mental Status: Awake and fully alert. Oriented to place and time. Recent and remote memory intact. Attention  span, concentration and fund of knowledge appropriate. Mood and affect appropriate.  Cranial Nerves: Pupils equal, briskly reactive to light. Extraocular movements full without nystagmus. Visual fields full to confrontation. Hearing intact. Facial sensation intact. Face, tongue, palate moves normally and symmetrically.  Motor: Normal bulk and tone. Normal strength in all tested extremity muscles Sensory.: intact to touch , pinprick , position and vibratory sensation.  Coordination: Rapid alternating movements normal in all extremities. Finger-to-nose and heel-to-shin performed accurately bilaterally. Gait and Station: Arises from chair without difficulty. Stance is normal. Gait demonstrates normal stride length  and balance without use of AD. Tandem walk and heel toe without difficulty.  Reflexes: 1+ and symmetric. Toes downgoing.         ASSESSMENT: Brian [name] is a 32 y.o. year old male with recent diagnosis of severe sleep apnea in 09/2021 and initiation of CPAP 12/2021.      PLAN:  OSA on CPAP : Compliance report shows satisfactory usage with optimal residual AHI.  Discussed continued nightly usage with ensuring greater than 4 hours nightly for optimal benefit and per insurance purposes.  Continue to follow with DME company for any needed supplies or CPAP related concerns    Follow up in 6 months or call earlier if needed   CC:  PCP: Lucianne Lei, MD    I spent 24 minutes of face-to-face and non-face-to-face time with patient.  This included previsit chart review, lab review, study review, order entry, electronic health record documentation, patient education regarding diagnosis of sleep apnea with review and discussion of compliance report and answered all other questions to patient's satisfaction   Frann Rider, [facility]  [facility] [address] [place], [place] [address]  Phone [phone] Fax [phone] Note: This document was  prepared with digital dictation and possible smart phrase technology. Any transcriptional errors that result from this process are unintentional.

## 2022-09-11 ENCOUNTER — Telehealth: Payer: Medicaid Other | Admitting: Adult Health

## 2022-09-13 ENCOUNTER — Ambulatory Visit
Admission: RE | Admit: 2022-09-13 | Discharge: 2022-09-13 | Disposition: A | Discharge status: Home / Self Care | Payer: 59 | Source: Ambulatory Visit | Attending: Nurse Practitioner | Admitting: Nurse Practitioner

## 2022-09-13 DIAGNOSIS — R1011 Right upper quadrant pain: Secondary | ICD-10-CM

## 2022-10-22 ENCOUNTER — Other Ambulatory Visit (HOSPITAL_COMMUNITY): Payer: Self-pay | Admitting: Psychiatry

## 2022-10-22 DIAGNOSIS — F410 Panic disorder [episodic paroxysmal anxiety] without agoraphobia: Secondary | ICD-10-CM

## 2022-10-24 ENCOUNTER — Encounter (HOSPITAL_COMMUNITY): Payer: Self-pay | Admitting: Psychiatry

## 2022-10-24 ENCOUNTER — Telehealth (HOSPITAL_BASED_OUTPATIENT_CLINIC_OR_DEPARTMENT_OTHER): Payer: 59 | Admitting: Psychiatry

## 2022-10-24 DIAGNOSIS — F410 Panic disorder [episodic paroxysmal anxiety] without agoraphobia: Secondary | ICD-10-CM

## 2022-10-24 DIAGNOSIS — F4310 Post-traumatic stress disorder, unspecified: Secondary | ICD-10-CM | POA: Diagnosis not present

## 2022-10-24 MED ORDER — CLONAZEPAM 0.5 MG PO TABS: 0.5000 mg | ORAL_TABLET | Freq: Every evening | ORAL | 2 refills | Status: DC | PRN

## 2022-10-24 MED ORDER — DULOXETINE HCL 60 MG PO CPEP: 60.0000 mg | ORAL_CAPSULE | Freq: Every day | ORAL | 0 refills | Status: DC

## 2022-10-24 MED ORDER — BUSPIRONE HCL 15 MG PO TABS: 15.0000 mg | ORAL_TABLET | Freq: Two times a day (BID) | ORAL | 0 refills | Status: DC

## 2022-10-24 NOTE — Progress Notes (Signed)
[facility] MD Virtual Progress Note   Patient Location: In Car Provider Location: Home  I connect with patient by telephone and verified that I am speaking with correct person by using two identifiers. I discussed the limitations of evaluation and management by telemedicine and the availability of in person appointments. I also discussed with the patient that there may be a patient responsible charge related to this service. The patient expressed understanding and agreed to proceed.  Brian [name] [id number] 32 y.o.  10/24/2022 10:08 AM  History of Present Illness:  Patient is evaluated by phone session.  He is driving and going to work.  Patient told he had a good Christmas and holidays.  He had cakes and he really enjoyed the company.  He feels things are going very well and he denies any severe panic attack or crying spells.  He is busy in his work and taking some time off in August to visit more alcohol in his pain.  Since he had a knee surgery he is feeling much better.  He started going to gym on a regular basis.  He also started a new medication for blood sugar and weight loss and he lost 20 pounds since the last visit.  Denies any crying spells or any feeling of hopelessness.  Occasionally has nightmares but manageable.  He does take Klonopin 3 times in a week which helps his sleep.  He is no longer taking any narcotic pain medication.  His energy level is good.  Like to keep the BuSpar, Cymbalta and Klonopin.  Past Psychiatric History: H/O panic attacks.  Given valium by Dr Estill Batten. Saw therapist at [facility] mental health.  Took Ambien, Xanax and Paxil from urgent [facility] in 2017.  Trazodone, Doxepin did not helped. No h/o inpatient treatment, psychosis, suicidal attempt, abuse, paranoia or self abusive behavior.    Outpatient Encounter Medications as of 10/24/2022  Medication Sig   busPIRone (BUSPAR) 15 MG tablet Take 1 tablet (15 mg total) by mouth 2  (two) times daily.   clonazePAM (KLONOPIN) 0.5 MG tablet Take 1 tablet (0.5 mg total) by mouth at bedtime as needed for anxiety (panic attacks).   DULoxetine (CYMBALTA) 60 MG capsule Take 1 capsule (60 mg total) by mouth daily.   ibuprofen (ADVIL) 600 MG tablet Take 1 tablet (600 mg total) by mouth every 6 (six) hours as needed.   valsartan-hydrochlorothiazide (DIOVAN-HCT) 80-12.5 MG tablet Take 1 tablet by mouth daily.   No facility-administered encounter medications on file as of 10/24/2022.    No results found for this or any previous visit (from the past 2160 hour(s)).   Psychiatric Specialty Exam: Physical Exam  Review of Systems  Weight 275 lb (124.7 kg).There is no height or weight on file to calculate BMI.  General Appearance: NA  Eye Contact:  NA  Speech:  Clear and Coherent and Normal Rate  Volume:  Normal  Mood:  Euthymic  Affect:  NA  Thought Process:  Goal Directed  Orientation:  Full (Time, Place, and Person)  Thought Content:  Logical  Suicidal Thoughts:  No  Homicidal Thoughts:  No  Memory:  Immediate;   Good Recent;   Good Remote;   Good  Judgement:  Good  Insight:  Present  Psychomotor Activity:  Normal  Concentration:  Concentration: Good and Attention Span: Good  Recall:  Good  Fund of Knowledge:  Good  Language:  Good  Akathisia:  No  Handed:  Right  AIMS (  if indicated):     Assets:  Communication Skills Desire for [place] Talents/Skills Transportation  ADL's:  Intact  Cognition:  WNL  Sleep:  ok     Assessment/Plan: Panic attacks - Plan: busPIRone (BUSPAR) 15 MG tablet, clonazePAM (KLONOPIN) 0.5 MG tablet  PTSD (post-traumatic stress disorder) - Plan: busPIRone (BUSPAR) 15 MG tablet, DULoxetine (CYMBALTA) 60 MG capsule  Stable.  Patient doing okay on his current medication.  Discussed benzodiazepine dependence tolerance and withdrawal.  Patient is going in August for vacation in [place] and [place].  He is excited  about it.  Discussed medication side effects and benefits.  Continue [name] to take as needed bedtime, Cymbalta 60 mg daily and BuSpar 15 mg 2 times a day.  Recommend to call us back if is any question or any concern.  Follow-up in 3 months.     Follow Up Instructions:     I discussed the assessment and treatment plan with the patient. The patient was provided an opportunity to ask questions and all were answered. The patient agreed with the plan and demonstrated an understanding of the instructions.   The patient was advised to call back or seek an in-person evaluation if the symptoms worsen or if the condition fails to improve as anticipated.    Collaboration of Care: Other provider involved in patient's care AEB notes are available in epic to review.  Patient/Guardian was advised Release of Information must be obtained prior to any record release in order to collaborate their care with an outside provider. Patient/Guardian was advised if they have not already done so to contact the registration department to sign all necessary forms in order for [place] to release information regarding their care.   Consent: Patient/Guardian gives verbal consent for treatment and assignment of benefits for services provided during this visit. Patient/Guardian expressed understanding and agreed to proceed.     I provided 14 minutes of non face to face time during this encounter.  Kathlee Nations, MD 10/24/2022

## 2022-11-25 IMAGING — DX DG CHEST 2V
2 series · 2 of 2 positions shown · non-contrast
Comparison: None.

CLINICAL DATA: COVID positive

EXAM:
CHEST - 2 VIEW

[chest pa]
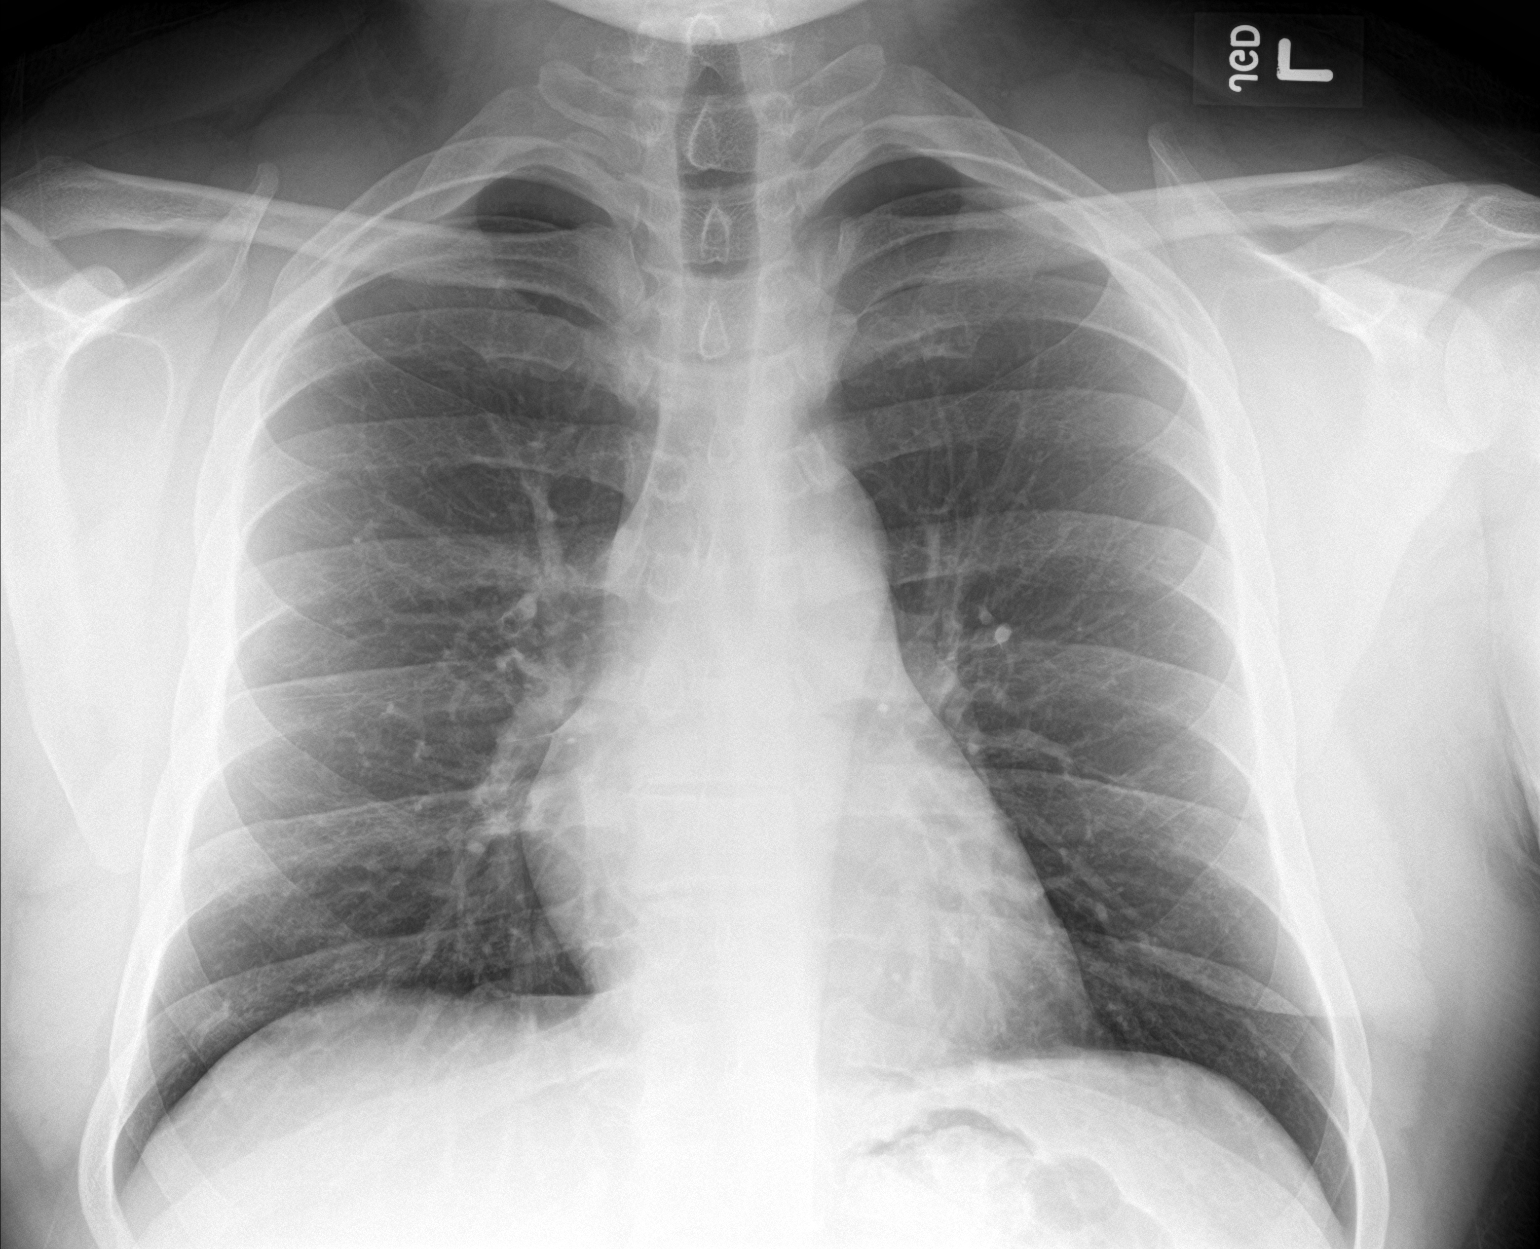

[chest lat]
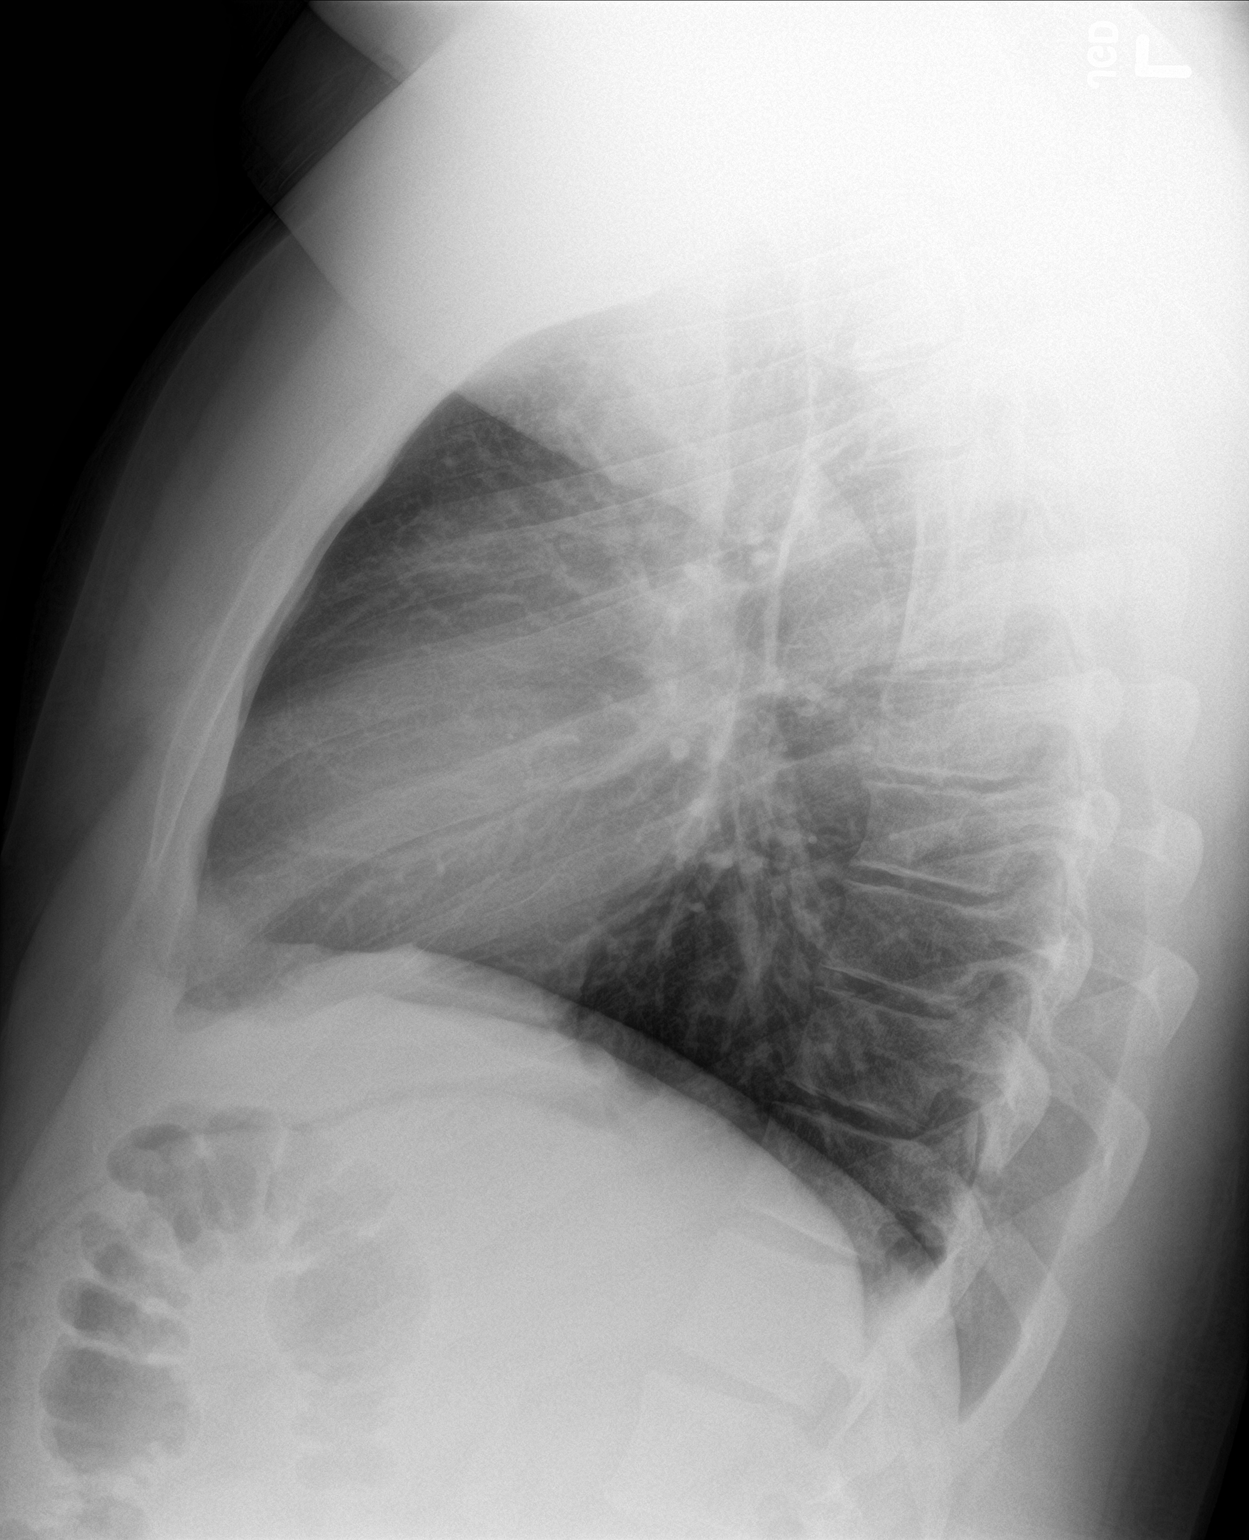

[2 of 2 positions shown; findings below may reference images not displayed]

FINDINGS: The heart size and mediastinal contours are within normal limits.
Both lungs are clear. The visualized skeletal structures are
unremarkable.
IMPRESSION: No active cardiopulmonary disease.

## 2023-01-19 ENCOUNTER — Other Ambulatory Visit (HOSPITAL_COMMUNITY): Payer: Self-pay | Admitting: Psychiatry

## 2023-01-19 DIAGNOSIS — F410 Panic disorder [episodic paroxysmal anxiety] without agoraphobia: Secondary | ICD-10-CM

## 2023-01-23 ENCOUNTER — Telehealth (HOSPITAL_BASED_OUTPATIENT_CLINIC_OR_DEPARTMENT_OTHER): Payer: 59 | Admitting: Psychiatry

## 2023-01-23 ENCOUNTER — Encounter (HOSPITAL_COMMUNITY): Payer: Self-pay | Admitting: Psychiatry

## 2023-01-23 VITALS — Wt 243.0 lb

## 2023-01-23 DIAGNOSIS — F410 Panic disorder [episodic paroxysmal anxiety] without agoraphobia: Secondary | ICD-10-CM

## 2023-01-23 DIAGNOSIS — F4310 Post-traumatic stress disorder, unspecified: Secondary | ICD-10-CM | POA: Diagnosis not present

## 2023-01-23 MED ORDER — DULOXETINE HCL 60 MG PO CPEP: 60.0000 mg | ORAL_CAPSULE | Freq: Every day | ORAL | 0 refills | Status: DC

## 2023-01-23 MED ORDER — CLONAZEPAM 0.5 MG PO TABS: 0.5000 mg | ORAL_TABLET | Freq: Every evening | ORAL | 2 refills | Status: DC | PRN

## 2023-01-23 MED ORDER — BUSPIRONE HCL 15 MG PO TABS: 15.0000 mg | ORAL_TABLET | Freq: Two times a day (BID) | ORAL | 0 refills | Status: AC

## 2023-01-23 NOTE — Progress Notes (Signed)
[facility] MD Virtual Progress Note   Patient Location: In [facility] Provider Location: Home Office  I connect with patient by video and verified that I am speaking with correct person by using two identifiers. I discussed the limitations of evaluation and management by telemedicine and the availability of in person appointments. I also discussed with the patient that there may be a patient responsible charge related to this service. The patient expressed understanding and agreed to proceed.  Brian [name] [id number] 31 y.o.  01/23/2023 8:37 AM  History of Present Illness:  Patient is evaluated by video session.  He is been doing well on his medication.  Lately his job is very busy and sometimes challenging because of the long weather.  He has to work in the attic with extreme heat.  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  He could not sleep those nights.  However overall he feels the medicine is working.  He denies any crying spells or any feeling of hopelessness or worthlessness.  He is trying to lose weight and he has lost more than 30 pounds in 3 months.  He is going to gym 5 days a week.  His energy level is good.  He feels good about himself.  He is happy his 77 and 34 year old kids are staying with him during the summer.  He is also excited about overseas trip with his wife.  Initially his plan was to visit [place] and his pain but now he changed his plan because of warm weather.  He is now going to [place] and [place].  Patient denies any tremors, shakes or any EPS.  He is taking BuSpar and Cymbalta.  He takes the Klonopin at bedtime which helps his sleep and anxiety.  Patient told since he is going overseas he may need extra Klonopin to help his anxiety as he does not like flying.  Patient does not drink or use any illegal substances.  Denies any mania or psychosis.  Past Psychiatric History: H/O panic attacks.  Given  valium by Dr Corky Sox. Saw therapist at [facility] mental health.  [facility]  Trazodone, Doxepin did not helped. No h/o inpatient treatment, psychosis, suicidal attempt, abuse, paranoia or self abusive behavior.    Outpatient Encounter Medications as of 01/23/2023  Medication Sig   busPIRone (BUSPAR) 15 MG tablet Take 1 tablet (15 mg total) by mouth 2 (two) times daily.   clonazePAM (KLONOPIN) 0.5 MG tablet Take 1 tablet (0.5 mg total) by mouth at bedtime as needed for anxiety (panic attacks).   DULoxetine (CYMBALTA) 60 MG capsule Take 1 capsule (60 mg total) by mouth daily.   ibuprofen (ADVIL) 600 MG tablet Take 1 tablet (600 mg total) by mouth every 6 (six) hours as needed.   valsartan-hydrochlorothiazide (DIOVAN-HCT) 80-12.5 MG tablet Take 1 tablet by mouth daily.   ZEPBOUND 2.5 MG/0.5ML Pen Inject 2.5 mg into the skin once a week.   No facility-administered encounter medications on file as of 01/23/2023.    No results found for this or any previous visit (from the past 2160 hour(s)).   Psychiatric Specialty Exam: Physical Exam  Review of Systems  Weight 243 lb (110.2 kg).There is no height or weight on file to calculate BMI.  General Appearance: Casual  Eye Contact:  Good  Speech:  Normal Rate  Volume:  Normal  Mood:  Euthymic  Affect:  Appropriate  Thought Process:  Goal Directed  Orientation:  Full (Time, Place, and Person)  Thought Content:  Logical  Suicidal Thoughts:  No  Homicidal Thoughts:  No  Memory:  Immediate;   Good Recent;   Good Remote;   Good  Judgement:  Good  Insight:  Good  Psychomotor Activity:  Normal  Concentration:  Concentration: Good and Attention Span: Good  Recall:  Good  Fund of Knowledge:  Good  Language:  Good  Akathisia:  No  Handed:  Right  AIMS (if indicated):     Assets:  Communication Skills Desire for Improvement Housing Resilience Social  Support Talents/Skills Transportation  ADL's:  Intact  Cognition:  WNL  Sleep:  ok     Assessment/Plan: PTSD (post-traumatic stress disorder) - Plan: busPIRone (BUSPAR) 15 MG tablet, DULoxetine (CYMBALTA) 60 MG capsule  Panic attacks - Plan: busPIRone (BUSPAR) 15 MG tablet, clonazePAM (KLONOPIN) 0.5 MG tablet  Discussed upcoming trip to [place] and [place]. We will provide 10 extra Klonopin which he can use when he is traveling overseas for 2 weeks.  Encourage watching his calorie intake, regular exercise.  He had lost weight and he feels good about it.  Reassurance given, patient is aware that he need to avoid watching movies which triggers PTSD.  He is not interested in therapy.  He feels things are going very well and he can handle the symptoms better.  Continue Cymbalta 60 mg daily and BuSpar 15 mg 2 times a day.  Recommend to call us back if there are any questions or any concern.  Follow-up in 3 months.  We will reduce to Klonopin 30 pills a month when he returned from vacation.   Follow Up Instructions:     I discussed the assessment and treatment plan with the patient. The patient was provided an opportunity to ask questions and all were answered. The patient agreed with the plan and demonstrated an understanding of the instructions.   The patient was advised to call back or seek an in-person evaluation if the symptoms worsen or if the condition fails to improve as anticipated.    Collaboration of Care: Other provider involved in patient's care AEB notes are available in epic to review.  Patient/Guardian was advised Release of Information must be obtained prior to any record release in order to collaborate their care with an outside provider. Patient/Guardian was advised if they have not already done so to contact the registration department to sign all necessary forms in order for [place] to release information regarding their care.   Consent: Patient/Guardian gives verbal consent for  treatment and assignment of benefits for services provided during this visit. Patient/Guardian expressed understanding and agreed to proceed.     I provided 22 minutes of non face to face time during this encounter.  Note: This document was prepared by [employer] voice dictation technology and any errors that results from this process are unintentional.    Cleotis Nipper, MD 01/23/2023

## 2023-02-26 ENCOUNTER — Other Ambulatory Visit (HOSPITAL_COMMUNITY): Payer: Self-pay

## 2023-02-26 MED ORDER — ZEPBOUND 5 MG/0.5ML ~~LOC~~ SOAJ
5.0000 mg | SUBCUTANEOUS | 2 refills | Status: AC
  Filled 2023-02-26: qty 2, 28d supply, fill #0

## 2023-03-01 ENCOUNTER — Other Ambulatory Visit (HOSPITAL_COMMUNITY): Payer: Self-pay

## 2023-03-05 ENCOUNTER — Other Ambulatory Visit (HOSPITAL_COMMUNITY): Payer: Self-pay

## 2023-03-07 ENCOUNTER — Other Ambulatory Visit (HOSPITAL_COMMUNITY): Payer: Self-pay

## 2023-03-27 ENCOUNTER — Other Ambulatory Visit (HOSPITAL_COMMUNITY): Payer: Self-pay

## 2023-03-28 ENCOUNTER — Other Ambulatory Visit (HOSPITAL_COMMUNITY): Payer: Self-pay

## 2023-04-24 ENCOUNTER — Telehealth (HOSPITAL_BASED_OUTPATIENT_CLINIC_OR_DEPARTMENT_OTHER): Payer: 59 | Admitting: Psychiatry

## 2023-04-24 ENCOUNTER — Encounter (HOSPITAL_COMMUNITY): Payer: Self-pay | Admitting: Psychiatry

## 2023-04-24 VITALS — Wt 235.0 lb

## 2023-04-24 DIAGNOSIS — F410 Panic disorder [episodic paroxysmal anxiety] without agoraphobia: Secondary | ICD-10-CM

## 2023-04-24 DIAGNOSIS — F4310 Post-traumatic stress disorder, unspecified: Secondary | ICD-10-CM

## 2023-04-24 MED ORDER — DULOXETINE HCL 60 MG PO CPEP: 60.0000 mg | ORAL_CAPSULE | Freq: Every day | ORAL | 0 refills | Status: DC

## 2023-04-24 MED ORDER — CLONAZEPAM 0.5 MG PO TABS: 0.5000 mg | ORAL_TABLET | Freq: Every evening | ORAL | 2 refills | Status: DC | PRN

## 2023-04-24 MED ORDER — BUSPIRONE HCL 15 MG PO TABS: 15.0000 mg | ORAL_TABLET | Freq: Two times a day (BID) | ORAL | 0 refills | Status: DC

## 2023-04-24 NOTE — Progress Notes (Signed)
[facility] MD Virtual Progress Note   Patient Location: In Car Provider Location: Home Office  I connect with patient by video and verified that I am speaking with correct person by using two identifiers. I discussed the limitations of evaluation and management by telemedicine and the availability of in person appointments. I also discussed with the patient that there may be a patient responsible charge related to this service. The patient expressed understanding and agreed to proceed.  Brian [name] [id number] 31 y.o.  04/24/2023 8:45 AM  History of Present Illness:  Patient is evaluated by video session.  He is doing well on his medication.  He had a very good trip to [place] and overseas.  He was able to see soccer matches in [place] and usually enjoyed.  Since weather is better he is enjoying his job.  He occasionally have nightmares and flashbacks.  Last Sunday he had a panic attack when he was in the funeral but was able to manage his symptoms.  He continues to go to gym 5 times a week and able to lost few more pounds since the last visit.  He is very happy as wife is expecting a child on New Year's.  He is also happy that his 1 and 33 year old boys will be around at the same time at his place.  He has no tremors, shakes or any EPS.  He denies any crying spells or any feeling of hopelessness or worthlessness.  He took prednisone last month but he had irritability anxiousness and he is pleased that he is no longer on prednisone.  He takes Klonopin that helps his night he also uses CPAP.  Denies any feeling of hopelessness or worthlessness.  He like to keep his current medication.  Past Psychiatric History: H/O panic attacks.  Given valium by Dr Corky Sox. Saw therapist at [facility] mental [facility].  [facility]  Trazodone, Doxepin did not helped. No h/o inpatient treatment, psychosis, suicidal attempt, abuse, paranoia or  self abusive behavior.    Outpatient Encounter Medications as of 04/24/2023  Medication Sig   clonazePAM (KLONOPIN) 0.5 MG tablet Take 1 tablet (0.5 mg total) by mouth at bedtime as needed and may repeat dose one time if needed for anxiety (panic attacks).   DULoxetine (CYMBALTA) 60 MG capsule Take 1 capsule (60 mg total) by mouth daily.   ibuprofen (ADVIL) 600 MG tablet Take 1 tablet (600 mg total) by mouth every 6 (six) hours as needed.   tirzepatide (ZEPBOUND) 5 MG/0.5ML Pen Inject 5 mg into the skin once a week.   valsartan-hydrochlorothiazide (DIOVAN-HCT) 80-12.5 MG tablet Take 1 tablet by mouth daily.   ZEPBOUND 2.5 MG/0.5ML Pen Inject 2.5 mg into the skin once a week.   No facility-administered encounter medications on file as of 04/24/2023.    No results found for this or any previous visit (from the past 2160 hour(s)).   Psychiatric Specialty Exam: Physical Exam  Review of Systems  There were no vitals taken for this visit.There is no height or weight on file to calculate BMI.  General Appearance: Casual  Eye Contact:  Good  Speech:  Clear and Coherent  Volume:  Normal  Mood:  Euthymic  Affect:  Appropriate  Thought Process:  Goal Directed  Orientation:  Full (Time, Place, and Person)  Thought Content:  Logical  Suicidal Thoughts:  No  Homicidal Thoughts:  No  Memory:  Immediate;   Good  Recent;   Good Remote;   Good  Judgement:  Good  Insight:  Good  Psychomotor Activity:  Normal  Concentration:  Concentration: Good and Attention Span: Good  Recall:  Good  Fund of Knowledge:  Good  Language:  Good  Akathisia:  No  Handed:  Right  AIMS (if indicated):     Assets:  Communication Skills Desire for Improvement Financial Resources/Insurance Housing Social Support Talents/Skills Transportation  ADL's:  Intact  Cognition:  WNL  Sleep:  ok with CPAP     Assessment/Plan: PTSD (post-traumatic stress disorder) - Plan: DULoxetine (CYMBALTA) 60 MG capsule,  busPIRone (BUSPAR) 15 MG tablet  Panic attacks - Plan: clonazePAM (KLONOPIN) 0.5 MG tablet, busPIRone (BUSPAR) 15 MG tablet  Patient is stable on his current medication.  Continue [name] at bedtime which she takes every night and be provided 10 extra tablets in case he has major panic attack.  Continue Cymbalta 60 mg daily and BuSpar 15 mg 2 times a day.  Recommended to call us back with any question or any concern.  We will reduce Klonopin back to 30 pill a day and the future.  Patient wife is expecting a baby boy on New Year's eve.  Follow-up in 3 months   Follow Up Instructions:     I discussed the assessment and treatment plan with the patient. The patient was provided an opportunity to ask questions and all were answered. The patient agreed with the plan and demonstrated an understanding of the instructions.   The patient was advised to call back or seek an in-person evaluation if the symptoms worsen or if the condition fails to improve as anticipated.    Collaboration of Care: Other provider involved in patient's care AEB notes are available in epic to review.  Patient/Guardian was advised Release of Information must be obtained prior to any record release in order to collaborate their care with an outside provider. Patient/Guardian was advised if they have not already done so to contact the registration department to sign all necessary forms in order for [place] to release information regarding their care.   Consent: Patient/Guardian gives verbal consent for treatment and assignment of benefits for services provided during this visit. Patient/Guardian expressed understanding and agreed to proceed.     I provided 18 minutes of non face to face time during this encounter.  Note: This document was prepared by [employer] voice dictation technology and any errors that results from this process are unintentional.    Cleotis Nipper, MD 04/24/2023

## 2023-05-01 ENCOUNTER — Other Ambulatory Visit (HOSPITAL_COMMUNITY): Payer: Self-pay

## 2023-05-07 ENCOUNTER — Other Ambulatory Visit (HOSPITAL_COMMUNITY): Payer: Self-pay

## 2023-05-12 ENCOUNTER — Other Ambulatory Visit (HOSPITAL_COMMUNITY): Payer: Self-pay

## 2023-05-15 ENCOUNTER — Other Ambulatory Visit (HOSPITAL_COMMUNITY): Payer: Self-pay

## 2023-06-13 ENCOUNTER — Telehealth (HOSPITAL_COMMUNITY): Payer: Self-pay | Admitting: *Deleted

## 2023-06-13 NOTE — Telephone Encounter (Signed)
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 Medication is currently ordered as [name] take 1 tablet at bedtime and may repeat x 1 for anxiety. Pt last seen on 04/24/23 and has a f/u on 07/24/23. Please review and advise.

## 2023-06-14 ENCOUNTER — Other Ambulatory Visit (HOSPITAL_COMMUNITY): Payer: Self-pay | Admitting: *Deleted

## 2023-06-14 MED ORDER — LORAZEPAM 0.5 MG PO TABS
0.5000 mg | ORAL_TABLET | Freq: Every day | ORAL | 0 refills | Status: DC
Start: 2023-06-14 — End: 2023-07-24

## 2023-06-14 NOTE — Telephone Encounter (Signed)
Writer LVM for pt. Awaiting return call.

## 2023-06-14 NOTE — Telephone Encounter (Signed)
He can try Ativan 0.5 mg to take as needed for anxiety and for flying.  Need to stop the Klonopin.  If he agreed please send the prescription for the same directions to take Ativan 0.5 mg daily at bedtime and to take 1 tablet before flying.  Maximum 45 tablets.

## 2023-07-24 ENCOUNTER — Telehealth (HOSPITAL_BASED_OUTPATIENT_CLINIC_OR_DEPARTMENT_OTHER): Payer: 59 | Admitting: Psychiatry

## 2023-07-24 ENCOUNTER — Encounter (HOSPITAL_COMMUNITY): Payer: Self-pay | Admitting: Psychiatry

## 2023-07-24 VITALS — Wt 235.0 lb

## 2023-07-24 DIAGNOSIS — F4310 Post-traumatic stress disorder, unspecified: Secondary | ICD-10-CM | POA: Diagnosis not present

## 2023-07-24 DIAGNOSIS — F410 Panic disorder [episodic paroxysmal anxiety] without agoraphobia: Secondary | ICD-10-CM

## 2023-07-24 DIAGNOSIS — F411 Generalized anxiety disorder: Secondary | ICD-10-CM

## 2023-07-24 MED ORDER — BUSPIRONE HCL 15 MG PO TABS: 15.0000 mg | ORAL_TABLET | Freq: Two times a day (BID) | ORAL | 0 refills | Status: DC

## 2023-07-24 MED ORDER — CLONAZEPAM 0.5 MG PO TABS: 0.5000 mg | ORAL_TABLET | Freq: Every evening | ORAL | 1 refills | Status: DC | PRN

## 2023-07-24 MED ORDER — DULOXETINE HCL 60 MG PO CPEP: 60.0000 mg | ORAL_CAPSULE | Freq: Every day | ORAL | 0 refills | Status: DC
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
## 2023-09-28 ENCOUNTER — Encounter (HOSPITAL_COMMUNITY): Payer: Self-pay

## 2023-10-03 ENCOUNTER — Other Ambulatory Visit (HOSPITAL_COMMUNITY): Payer: Self-pay | Admitting: Psychiatry

## 2023-10-03 DIAGNOSIS — F4310 Post-traumatic stress disorder, unspecified: Secondary | ICD-10-CM

## 2023-10-03 DIAGNOSIS — F410 Panic disorder [episodic paroxysmal anxiety] without agoraphobia: Secondary | ICD-10-CM

## 2023-10-05 ENCOUNTER — Encounter (HOSPITAL_COMMUNITY): Payer: Self-pay | Admitting: Psychiatry

## 2023-10-05 ENCOUNTER — Telehealth (HOSPITAL_BASED_OUTPATIENT_CLINIC_OR_DEPARTMENT_OTHER): Payer: 59 | Admitting: Psychiatry

## 2023-10-05 VITALS — Wt 243.0 lb

## 2023-10-05 DIAGNOSIS — F410 Panic disorder [episodic paroxysmal anxiety] without agoraphobia: Secondary | ICD-10-CM | POA: Diagnosis not present

## 2023-10-05 DIAGNOSIS — F4310 Post-traumatic stress disorder, unspecified: Secondary | ICD-10-CM

## 2023-10-05 DIAGNOSIS — F411 Generalized anxiety disorder: Secondary | ICD-10-CM

## 2023-10-05 MED ORDER — BUSPIRONE HCL 15 MG PO TABS: 15.0000 mg | ORAL_TABLET | Freq: Two times a day (BID) | ORAL | 0 refills | Status: DC

## 2023-10-05 MED ORDER — DULOXETINE HCL 30 MG PO CPEP: ORAL_CAPSULE | ORAL | 1 refills | Status: DC

## 2023-10-05 MED ORDER — CLONAZEPAM 0.5 MG PO TABS: 0.5000 mg | ORAL_TABLET | Freq: Every evening | ORAL | 1 refills | Status: DC | PRN

## 2023-10-05 NOTE — Progress Notes (Signed)
[facility] MD/PA/NP OP Progress Note  Virtual Visit via Video Note  I connected with Brian [name] on 10/05/23 at 11:30 AM EST by a video enabled telemedicine application and verified that I am speaking with the correct person using two identifiers.  Location: Patient: Home Provider: Home Office   I discussed the limitations of evaluation and management by telemedicine and the availability of in person appointments. The patient expressed understanding and agreed to proceed.  10/05/2023 11:37 AM Delorse Lek  MRN:  [id number]  Chief Complaint:  Chief Complaint  Patient presents with   Follow-up   Medication Refill   HPI: Patient is evaluated by video session.  He reported worsening of symptoms as had a injury while at work.  He had torn his left meniscus and had a surgery on February 17th.  He is out of work since then.  He admitted to struggle with anxiety, depression.  He also told that he was out of Cymbalta for more than a month as during the process of moving he had lost the bottle.  He looked everywhere to find the bottles but could not find and did not call us for the refills.  He also lost BuSpar.  He is without medication and had a hard time dealing with his emotional, depression.  There are times he had panic attacks, insomnia.  Patient also had to see the baby who is now 5 weeks old.  He feels loss of freedom has not able to go back to work until his wound completely healed.  He is using crutches for walking.  Denies any suicidal thoughts or homicidal thoughts.  His wife started taking Zoloft from primary care after postpartum depression.  Patient told that he is trying to struggle to keep his emotions under control.  Denies any suicidal thoughts but admitted a lot of ruminative and negative thoughts.  He denies any hallucination, paranoia.  He is not sleeping as good and has nightmares.  He admitted weight gain since having sedentary lifestyle.  He recall no major concern from the medication.   He is taking Klonopin on a regular basis to help his anxiety.  He denies drinking or using any illegal substances.  Visit Diagnosis:    ICD-10-CM   1. GAD (generalized anxiety disorder)  F41.1 busPIRone (BUSPAR) 15 MG tablet    2. PTSD (post-traumatic stress disorder)  F43.10 busPIRone (BUSPAR) 15 MG tablet    clonazePAM (KLONOPIN) 0.5 MG tablet    DULoxetine (CYMBALTA) 30 MG capsule    3. Panic attacks  F41.0 busPIRone (BUSPAR) 15 MG tablet    clonazePAM (KLONOPIN) 0.5 MG tablet      Past Psychiatric History: Reviewed H/O panic attacks.  Given valium by Dr Corky Sox. Saw therapist at [facility] mental health.  [facility]  Trazodone, Doxepin and Ativan did not helped. No h/o inpatient treatment, psychosis, suicidal attempt, abuse, paranoia or self abusive behavior.   Past Medical History:  Past Medical History:  Diagnosis Date   Anxiety    BMI 34.0-34.9,adult    Hypertension     Past Surgical History:  Procedure Laterality Date   ANTERIOR CRUCIATE LIGAMENT REPAIR     BACK SURGERY      Family Psychiatric History: Reviewed  Family History:  Family History  Problem Relation Age of Onset   Diabetes type II Mother    High Cholesterol Father    High blood pressure Father  DiGeorge syndrome Sister    High blood pressure Maternal Grandmother    Kidney disease Maternal Grandmother    Cancer Maternal Grandmother    Cancer Maternal Grandfather    Cancer Paternal Grandmother    High blood pressure Paternal Grandfather    Cancer Paternal Grandfather    Colon cancer Paternal Grandfather     Social History:  Social History   Socioeconomic History   Marital status: Significant Other    Spouse name: colleen   Number of children: 1   Years of education: Not on file   Highest education level: Some college, no degree  Occupational History   Not on file  Tobacco Use   Smoking status: Never   Smokeless tobacco:  Current    Types: Snuff  Vaping Use   Vaping status: Never Used  Substance and Sexual Activity   Alcohol use: Yes    Alcohol/week: 0.0 standard drinks of alcohol    Comment: occ   Drug use: No   Sexual activity: Yes    Birth control/protection: Condom, Pill  Other Topics Concern   Not on file  Social History Narrative   Lives with fiance   Right handed   Caffeine: very seldom   Social Drivers of Corporate investment banker Strain: Low Risk  (07/06/2023)   Received from [facility]   Overall Financial Resource Strain (CARDIA)    Difficulty of Paying Living Expenses: Not hard at all  Food Insecurity: No Food Insecurity (07/06/2023)   [facility]   Hunger Vital Sign    Worried About Running Out of Food in the Last Year: Never true    Ran Out of Food in the Last Year: Never true  Transportation Needs: No Transportation Needs (07/06/2023)   Received from [facility] - Transportation    Lack of Transportation (Medical): No    Lack of Transportation (Non-Medical): No  Physical Activity: Not on file  Stress: Not on file  Social Connections: Unknown (07/03/2022)   Received from [facility]   Social Network    Social Network: Not on file    Allergies:  Allergies  Allergen Reactions   Reglan [Metoclopramide] Other (See Comments)    Sweating and hot sensation in jaws an genital area     Metabolic Disorder Labs: No results found for: "HGBA1C", "MPG" No results found for: "PROLACTIN" No results found for: "CHOL", "TRIG", "HDL", "CHOLHDL", "VLDL", "LDLCALC" No results found for: "TSH"  Therapeutic Level Labs: No results found for: "LITHIUM" No results found for: "VALPROATE" No results found for: "CBMZ"  Current Medications: Current Outpatient Medications  Medication Sig Dispense Refill   busPIRone (BUSPAR) 15 MG tablet Take 1 tablet (15 mg total) by mouth 2 (two) times daily. 180 tablet 0   clonazePAM (KLONOPIN) 0.5 MG  tablet Take 1 tablet (0.5 mg total) by mouth at bedtime as needed and may repeat dose one time if needed for anxiety. 40 tablet 1   DULoxetine (CYMBALTA) 60 MG capsule Take 1 capsule (60 mg total) by mouth daily. 90 capsule 0   ibuprofen (ADVIL) 600 MG tablet Take 1 tablet (600 mg total) by mouth every 6 (six) hours as needed. 30 tablet 0   tirzepatide (ZEPBOUND) 5 MG/0.5ML Pen Inject 5 mg into the skin once a week. 2 mL 2   valsartan-hydrochlorothiazide (DIOVAN-HCT) 80-12.5 MG tablet Take 1 tablet by mouth daily.     ZEPBOUND 2.5 MG/0.5ML Pen Inject 2.5 mg into the skin  once a week.     No current facility-administered medications for this visit.     Musculoskeletal: Strength & Muscle Tone: within normal limits Gait & Station:  pain in left knee Patient leans:  left  Psychiatric Specialty Exam: Review of Systems  Musculoskeletal:        Left knee pain.  Using crutches for walking    Weight 243 lb (110.2 kg).There is no height or weight on file to calculate BMI.  General Appearance: Casual  Eye Contact:  Fair  Speech:  Slow  Volume:  Decreased  Mood:  Anxious and Dysphoric  Affect:  Depressed  Thought Process:  Goal Directed  Orientation:  Full (Time, Place, and Person)  Thought Content: Rumination   Suicidal Thoughts:  No  Homicidal Thoughts:  No  Memory:  Immediate;   Good Recent;   Good Remote;   Good  Judgement:  Intact  Insight:  Fair  Psychomotor Activity:  Decreased  Concentration:  Concentration: Fair and Attention Span: Fair  Recall:  Good  Fund of Knowledge: Good  Language: Good  Akathisia:  No  Handed:  Right  AIMS (if indicated): not done  Assets:  Communication Skills Desire for Improvement Housing Social Support Talents/Skills Transportation  ADL's:  Intact  Cognition: WNL  Sleep:  Fair   Screenings: GAD-7    Flowsheet Row Video Visit from 10/05/2023 in [facility]  Total GAD-7 Score 13      PHQ2-9     Flowsheet Row [facility] Office Visit from 11/10/2016 in Primary [facility] at [place] Office Visit from 10/03/2015 in [facility] Care at [facility] Total Score 3 0 0  PHQ-9 Total Score 5 -- --      Flowsheet Row Video [facility] 12/08/2020 [facility] Video Visit from 10/25/2020 in [facility] ED from 09/14/2020 in [facility] RISK CATEGORY No Risk No Risk No Risk        Assessment and Plan: Patient is 33 year old African-American man with a history of hypertension, sleep apnea and recently had surgery for his left horn meniscus.  I reviewed PHQ and anxiety screening.  I discussed possible withdrawals of the medication since he is not taking.  Encouraged to give [place] call back in the future if he has any issues with the medication of the refills.  We will start the Cymbalta 30 mg daily for 1 week and then 60 mg daily.  Patient used to take 60 mg without any issues.  He will also start the BuSpar which she is taking 15 mg twice a day.  Encouraged to take the Klonopin only as needed for severe panic attack.  Patient is hoping to get better and going to start physical therapy very soon.  Encouraged to call us back if has any question or any concern.  Follow-up in 6 weeks.  Discussed safety concerns at any time having active suicidal thoughts or homicidal thoughts and he need to call 911 or go to local emergency room.  Collaboration of Care: Collaboration of Care: Other provider involved in patient's care AEB notes are available in epic to review  Patient/Guardian was advised Release of Information must be obtained prior to any record release in order to collaborate their care with an outside provider. Patient/Guardian was advised if they have not already done so to contact the registration department to sign all necessary  forms  in order for [place] to release information regarding their care.   Consent: Patient/Guardian gives verbal consent for treatment and assignment of benefits for services provided during this visit. Patient/Guardian expressed understanding and agreed to proceed.   Follow Up Instructions:    I discussed the assessment and treatment plan with the patient. The patient was provided an opportunity to ask questions and all were answered. The patient agreed with the plan and demonstrated an understanding of the instructions.   The patient was advised to call back or seek an in-person evaluation if the symptoms worsen or if the condition fails to improve as anticipated.  I provided 31 minutes of non-face-to-face time during this encounter.   Cleotis Nipper, MD 10/05/2023, 11:37 AM

## 2023-10-29 ENCOUNTER — Other Ambulatory Visit (HOSPITAL_COMMUNITY): Payer: Self-pay | Admitting: Psychiatry

## 2023-10-29 DIAGNOSIS — F4310 Post-traumatic stress disorder, unspecified: Secondary | ICD-10-CM

## 2023-11-13 ENCOUNTER — Encounter (HOSPITAL_COMMUNITY): Payer: Self-pay

## 2023-11-16 ENCOUNTER — Encounter (HOSPITAL_COMMUNITY): Payer: Self-pay | Admitting: Psychiatry

## 2023-11-16 ENCOUNTER — Telehealth (HOSPITAL_COMMUNITY): Payer: 59 | Admitting: Psychiatry

## 2023-11-16 ENCOUNTER — Telehealth (HOSPITAL_COMMUNITY): Admitting: Psychiatry

## 2023-11-16 VITALS — Wt 238.0 lb

## 2023-11-16 DIAGNOSIS — F4310 Post-traumatic stress disorder, unspecified: Secondary | ICD-10-CM | POA: Diagnosis not present

## 2023-11-16 DIAGNOSIS — F411 Generalized anxiety disorder: Secondary | ICD-10-CM | POA: Diagnosis not present

## 2023-11-16 DIAGNOSIS — F410 Panic disorder [episodic paroxysmal anxiety] without agoraphobia: Secondary | ICD-10-CM | POA: Diagnosis not present

## 2023-11-16 MED ORDER — SERTRALINE HCL 50 MG PO TABS: ORAL_TABLET | ORAL | 1 refills | Status: DC

## 2023-11-16 MED ORDER — BUSPIRONE HCL 15 MG PO TABS: 15.0000 mg | ORAL_TABLET | Freq: Three times a day (TID) | ORAL | 1 refills | Status: AC

## 2023-11-16 NOTE — Progress Notes (Signed)
 [facility] MD Virtual Progress Note   Patient Location: In Car Provider Location: Home Office  I connect with patient by video and verified that I am speaking with correct person by using two identifiers. I discussed the limitations of evaluation and management by telemedicine and the availability of in person appointments. I also discussed with the patient that there may be a patient responsible charge related to this service. The patient expressed understanding and agreed to proceed.  Brian [name] [id number] 33 y.o.  11/16/2023 9:03 AM  History of Present Illness:  Patient is evaluated by video session.  We started him on Cymbalta which she has taken in the past but this time he could not tolerate very well and has started to have nausea, increased heart rate and after few days he stopped the Cymbalta.  He admitted continued to have a lot of anxiety and nervousness because he has not get paid since January.  His knee pain is slowly and gradually getting better.  He is hoping to start work April 21 which is earlier than his physician suggested but he cannot sit too long without a job.  Reported not doing job is making him more nervous and concerned about the future.  He also reported his 21-month-old baby has colic and he cries a lot and sometimes he cannot handle him very well.  He denies any aggression, violence.  He denies any suicidal thoughts or homicidal thoughts.  He had a good support from his wife.  Patient is looking forward to work starting April 21.  Patient told his job does not have a option to work light duty job.  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  Denies any crying spells or any feeling of hopelessness or worthlessness.  He admitted had a major panic attack and he took Klonopin.  He is taking BuSpar 15 mg 2 times a day.  He has no tremor or shakes or any EPS.  He is sleeping better.  He denies any nightmares flashback but still having  ruminative thoughts about his future.  He denies drinking or using any illegal substances.  He is trying to watch his calorie intake and able to lost few pounds since the last visit.  Past Psychiatric History: H/O panic attacks.  Given valium by Dr Corky Sox. Saw therapist at [facility].  [facility] Amb[facility]  Trazodone, Doxepin and Ativan did not helped.  Cymbalta initially helped but later had side effects No h/o inpatient treatment, psychosis, suicidal attempt, abuse, paranoia or self abusive behavior.    Outpatient Encounter Medications as of 11/16/2023  Medication Sig   sertraline (ZOLOFT) 50 MG tablet Take 1/2 tab daily for one week and than full tab daily   busPIRone (BUSPAR) 15 MG tablet Take 1 tablet (15 mg total) by mouth 3 (three) times daily.   clonazePAM (KLONOPIN) 0.5 MG tablet Take 1 tablet (0.5 mg total) by mouth at bedtime as needed and may repeat dose one time if needed for anxiety.   ibuprofen (ADVIL) 600 MG tablet Take 1 tablet (600 mg total) by mouth every 6 (six) hours as needed.   tirzepatide (ZEPBOUND) 5 MG/0.5ML Pen Inject 5 mg into the skin once a week. (Patient not taking: Reported on 10/05/2023)   valsartan-hydrochlorothiazide (DIOVAN-HCT) 80-12.5 MG tablet Take 1 tablet by mouth daily.   [DISCONTINUED] busPIRone (BUSPAR) 15 MG tablet Take 1 tablet (15  mg total) by mouth 2 (two) times daily.   [DISCONTINUED] DULoxetine (CYMBALTA) 30 MG capsule Take one capsule daily for one week and than twice daily   No facility-administered encounter medications on file as of 11/16/2023.    No results found for this or any previous visit (from the past 2160 hours).   Psychiatric Specialty Exam: Physical Exam  Review of Systems  Musculoskeletal:        Pain in left knee    Weight 238 lb (108 kg).There is no height or weight on file to calculate BMI.  General Appearance: Casual  Eye Contact:  Good  Speech:  Clear  and Coherent and Normal Rate  Volume:  Decreased  Mood:  Anxious and Dysphoric  Affect:  Depressed  Thought Process:  Goal Directed  Orientation:  Full (Time, Place, and Person)  Thought Content:  Rumination  Suicidal Thoughts:  No  Homicidal Thoughts:  No  Memory:  Immediate;   Good Recent;   Good Remote;   Good  Judgement:  Intact  Insight:  Present  Psychomotor Activity:  Decreased  Concentration:  Concentration: Fair and Attention Span: Fair  Recall:  Good  Fund of Knowledge:  Good  Language:  Good  Akathisia:  No  Handed:  Right  AIMS (if indicated):     Assets:  Communication Skills Desire for Improvement Housing Social Support Talents/Skills Transportation  ADL's:  Intact  Cognition:  WNL  Sleep:  5-6 hrs       11/16/2023    9:15 AM 10/05/2023   11:53 AM 11/10/2016    5:09 PM 10/03/2015   10:40 AM  Depression screen PHQ 2/9  Decreased Interest 1 2 0 0  Down, Depressed, Hopeless 1 1 0 0  PHQ - 2 Score 2 3 0 0  Altered sleeping 1 1    Tired, decreased energy 1 1    Change in appetite 0 0    Feeling bad or failure about yourself  0 0    Trouble concentrating 0 0    Moving slowly or fidgety/restless 0 0    Suicidal thoughts 0 0    PHQ-9 Score 4 5    Difficult doing work/chores Somewhat difficult Somewhat difficult         11/16/2023    9:14 AM 10/05/2023   11:53 AM  GAD 7 : Generalized Anxiety Score  Nervous, Anxious, on Edge 1 2  Control/stop worrying 1 2  Worry too much - different things 1 2  Trouble relaxing 0 3  Restless 0 1  Easily annoyed or irritable 0 3  Afraid - awful might happen 1 0  Total GAD 7 Score 4 13  Anxiety Difficulty Somewhat difficult Somewhat difficult     Assessment/Plan: PTSD (post-traumatic stress disorder) - Plan: busPIRone (BUSPAR) 15 MG tablet, sertraline (ZOLOFT) 50 MG tablet  Panic attacks - Plan: busPIRone (BUSPAR) 15 MG tablet, sertraline (ZOLOFT) 50 MG tablet  GAD (generalized anxiety disorder) - Plan: busPIRone  (BUSPAR) 15 MG tablet, sertraline (ZOLOFT) 50 MG tablet  Discontinue Cymbalta as patient could not tolerate even though he had taken in the past but this time having increased heart rate, nausea.  Discussed to try Zoloft.  Patient told his wife taking Zoloft and that has been working very well for her.  Will start 25 mg Zoloft for 1 week and then 50 mg daily.  I will also increase BuSpar to 15 mg 3 times a day.  He is hoping once he is back  to work April 21 he would be fine.  He does not need a Klonopin as he only takes for severe panic attack and still has few pills left over.  Recommend to call us back if is any question or any concern.  Follow-up in 2 months.   Follow Up Instructions:     I discussed the assessment and treatment plan with the patient. The patient was provided an opportunity to ask questions and all were answered. The patient agreed with the plan and demonstrated an understanding of the instructions.   The patient was advised to call back or seek an in-person evaluation if the symptoms worsen or if the condition fails to improve as anticipated.    Collaboration of Care: Other provider involved in patient's care AEB notes are available in epic to review  Patient/Guardian was advised Release of Information must be obtained prior to any record release in order to collaborate their care with an outside provider. Patient/Guardian was advised if they have not already done so to contact the registration department to sign all necessary forms in order for [place] to release information regarding their care.   Consent: Patient/Guardian gives verbal consent for treatment and assignment of benefits for services provided during this visit. Patient/Guardian expressed understanding and agreed to proceed.     I provided 27 minutes of non face to face time during this encounter.  Note: This document was prepared by [employer] voice dictation technology and any errors that results from this process  are unintentional.    Cleotis Nipper, MD 11/16/2023

## 2023-12-10 ENCOUNTER — Other Ambulatory Visit (HOSPITAL_COMMUNITY): Payer: Self-pay | Admitting: Psychiatry

## 2023-12-10 DIAGNOSIS — F410 Panic disorder [episodic paroxysmal anxiety] without agoraphobia: Secondary | ICD-10-CM

## 2023-12-10 DIAGNOSIS — F4310 Post-traumatic stress disorder, unspecified: Secondary | ICD-10-CM

## 2023-12-10 DIAGNOSIS — F411 Generalized anxiety disorder: Secondary | ICD-10-CM

## 2023-12-26 NOTE — Progress Notes (Unsigned)
 Brian [name]

## 2023-12-27 ENCOUNTER — Encounter: Payer: Self-pay | Admitting: Neurology

## 2023-12-27 ENCOUNTER — Ambulatory Visit: Admitting: Neurology

## 2023-12-27 VITALS — BP 145/89 | HR 76 | Ht 72.0 in | Wt 245.0 lb

## 2023-12-27 DIAGNOSIS — G4733 Obstructive sleep apnea (adult) (pediatric): Secondary | ICD-10-CM | POA: Diagnosis not present

## 2023-12-27 DIAGNOSIS — F4310 Post-traumatic stress disorder, unspecified: Secondary | ICD-10-CM

## 2023-12-27 NOTE — Progress Notes (Signed)
 Provider:  Neomia Banner, MD  Primary Care Physician:  Jonathon Neighbors, MD [address] [place] [address]     Referring Provider: Jonathon Neighbors, Md [address] [place],  [place] [address]          Chief Complaint according to patient   Patient presents with:          Here for follow up for his cpap. DME [facility]. Has been stable doing well. Since last visit, married has 75 mth old. But as far as sleep goes seems to be doing well and needs supplies       HISTORY OF PRESENT ILLNESS:  Brian [name] is a 33 y.o. male patient who is here for CPAP compliance revisit and was seen last in 2022 by me. Has a new baby, 23 months old ( the third son)  and in the meantime had travelled to [place]. His sleep is interrupted with the new baby, affecting compliance.   12/27/2023 : Compliance report for Mr. Offered CPAP is still excellent 97% compliance by days and 87% compliance by hours that means 26 out of 30 days he has used the CPAP for 4 hours or more consecutively.  The CPAP has a wide range of pressure between 7 cm at the lowest and 20 cm water at the highest.  Expiratory pressure relief is set at 3 cm water.  His 95th percentile pressure is 9.6 cm water I would actually suggest to reduce some of the maximum pressure downwards.  There is very little air leak with the 95th percentile being 6.9 L/min and this speaks for good mask fit.  He uses a nasal cradle . The residual AHI is 0.6/h and speaks for an excellent resolution of apnea.   .  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        Review of Systems: Out of a complete 14 system review, the patient complains of only the following symptoms, and all other reviewed systems are negative.:   Doing well in terms of sleep.  6-7 hours of sleep  pre baby.  Epworth SS : 1/ 14 .   FSS at 33/ 75    Social History   Socioeconomic History   Marital status: Significant Other    Spouse name: colleen   Number of children: 1   Years of education: Not on file   Highest education level: Some college, no degree  Occupational History   Not on file  Tobacco Use   Smoking status: Never   Smokeless tobacco: Current    Types: Snuff  Vaping Use   Vaping status: Never Used  Substance and Sexual Activity   Alcohol use: Yes    Alcohol/week: 0.0 standard drinks of alcohol    Comment: occ   Drug use: No   Sexual activity: Yes    Birth control/protection: Condom, Pill  Other Topics Concern   Not on file  Social History Narrative   Lives with fiance   Right handed   Caffeine: very seldom   Social Drivers of Corporate investment banker Strain: Patient Declined (11/20/2023)   Received from [facility]   Overall Financial Resource Strain (CARDIA)    Difficulty of Paying Living Expenses: Patient declined  Food Insecurity: No Food Insecurity (  11/20/2023)   Received from [facility]   Hunger Vital Sign    Worried About Running Out of Food in the Last Year: Never true    Ran Out of Food in the Last Year: Never true  Transportation Needs: No Transportation Needs (11/20/2023)   Received from [facility] - Transportation    Lack of Transportation (Medical): No    Lack of Transportation (Non-Medical): No  Physical Activity: Sufficiently Active (11/20/2023)   Received from [facility]   Exercise Vital Sign    Days of Exercise per Week: 5 days    Minutes of Exercise per Session: 30 min  Stress: Stress Concern Present (11/20/2023)   Received from [facility] - Occupational Stress Questionnaire    Feeling of Stress : Very much  Social Connections: Somewhat Isolated (11/20/2023)   Received from [facility]   Social Network    How would you rate your social network (family, work, friends)?: Restricted participation with some degree of  social isolation    Family History  Problem Relation Age of Onset   Diabetes type II Mother    High Cholesterol Father    High blood pressure Father    DiGeorge syndrome Sister    High blood pressure Maternal Grandmother    Kidney disease Maternal Grandmother    Cancer Maternal Grandmother    Cancer Maternal Grandfather    Cancer Paternal Grandmother    High blood pressure Paternal Grandfather    Cancer Paternal Grandfather    Colon cancer Paternal Grandfather     Past Medical History:  Diagnosis Date   Anxiety    BMI 34.0-34.9,adult    Hypertension     Past Surgical History:  Procedure Laterality Date   ANTERIOR CRUCIATE LIGAMENT REPAIR     BACK SURGERY       Current Outpatient Medications on File Prior to Visit  Medication Sig Dispense Refill   ALPRAZolam  (XANAX ) 1 MG tablet Take 0.5-1 mg by mouth 3 (three) times daily as needed for anxiety.     busPIRone  (BUSPAR ) 15 MG tablet Take 1 tablet (15 mg total) by mouth 3 (three) times daily. (Patient taking differently: Take 15 mg by mouth 2 (two) times daily.) 90 tablet 1   ibuprofen  (ADVIL ) 600 MG tablet Take 1 tablet (600 mg total) by mouth every 6 (six) hours as needed. 30 tablet 0   sertraline  (ZOLOFT ) 100 MG tablet Take 100 mg by mouth daily.     tirzepatide  (ZEPBOUND ) 5 MG/0.5ML Pen Inject 5 mg into the skin once a week. 2 mL 2   valsartan-hydrochlorothiazide (DIOVAN-HCT) 80-12.5 MG tablet Take 1 tablet by mouth daily.     No current facility-administered medications on file prior to visit.    Allergies  Allergen Reactions   Reglan  [Metoclopramide ] Other (See Comments)    Sweating and hot sensation in jaws an genital area      DIAGNOSTIC DATA (LABS, IMAGING, TESTING) - I reviewed patient records, labs, notes, testing and imaging myself where available.  Lab Results  Component Value Date   WBC 5.9 02/20/2018   HGB 15.9 02/20/2018   HCT 48.8 02/20/2018   MCV 89.2 02/20/2018   PLT 233 02/20/2018       Component Value Date/Time   NA 140 02/20/2018 1507   K 3.8 02/20/2018 1507   CL 104 02/20/2018 1507   CO2 29 02/20/2018 1507   GLUCOSE 79 02/20/2018 1507   BUN 10 02/20/2018  1507   CREATININE 1.04 02/20/2018 1507   CREATININE 0.96 03/14/2012 1936   CALCIUM 9.6 02/20/2018 1507   PROT 7.5 02/20/2018 1507   ALBUMIN 4.2 02/20/2018 1507   AST 33 02/20/2018 1507   ALT 37 02/20/2018 1507   ALKPHOS 92 02/20/2018 1507   BILITOT 0.4 02/20/2018 1507   GFRNONAA >60 02/20/2018 1507   GFRAA >60 02/20/2018 1507   New PCP is  the [facility] ;   On July 02, 2023 the patient underwent baseline laboratory tests with Lennard Quirk, PA at [facility] location of [place].    TSH was 3.3 units/mL in normal range. The total cholesterol was 211 slightly elevated triglycerides were 232 and were significantly elevated, the HDL or good cholesterol" was 62 and this is a good value.  Vit D LD was 40 just borderline normal LDL was 109 and the LDL to HDL ratio was 1.8 and would be in the normal range again.  White blood cell count was 5.2 red blood cell count 5.3 hemoglobin 15.9, hematocrit 47.3, mean corpuscular volume 88.6, mean corpuscular hemoglobin level 29.8 all normal.  Normal distribution between neutrophils lymphocytes monocytes       PHYSICAL EXAM:  Today's Vitals   12/27/23 1448  BP: (!) 145/89  Pulse: 76  Weight: 245 lb (111.1 kg)  Height: 6' (1.829 m)   Body mass index is 33.23 kg/m.   Wt Readings from Last 3 Encounters:  12/27/23 245 lb (111.1 kg)  03/14/22 295 lb (133.8 kg)  05/23/21 284 lb 8 oz (129 kg)     Ht Readings from Last 3 Encounters:  12/27/23 6' (1.829 m)  03/14/22 6' (1.829 m)  05/23/21 6' (1.829 m)      General: The patient is awake, alert and appears not in acute distress. The patient is well groomed. Lost 70 pounds since last visit !!!  Head: Neck is supple. Mallampati 1 , swollen and elongated uvula. ,  neck circumference: 18.5  inches (  from 20 inches) .   Nasal airflow  patent.   Retrognathia is not seen.  Dental status: wore braces.  Cardiovascular:  Regular rate and cardiac rhythm by pulse,  without distended neck veins. Respiratory: Lungs are clear to auscultation.  Skin:  Without evidence of ankle edema, or rash. Trunk: The patient's posture is erect.   Neurologic exam : The patient is awake and alert, oriented to place and time.   Memory subjective described as intact.  Attention span & concentration ability appears normal.  Speech is fluent,  without  dysarthria, dysphonia or aphasia.  Mood and affect are appropriate.   Cranial nerves: no loss of smell or taste reported  Pupils are equal and briskly reactive to light. Funduscopic exam deferred. .  Extraocular movements in vertical and horizontal planes were intact and without nystagmus. No Diplopia. Visual fields by finger perimetry are intact. Hearing was intact to soft voice and finger rubbing.    Facial sensation intact to fine touch.  Facial motor strength is symmetric and tongue and uvula move midline.  Neck ROM : rotation, tilt and flexion extension were normal for age and shoulder shrug was symmetrical.    Motor exam:  Symmetric bulk, tone and ROM.   Normal tone without cog wheeling, symmetric grip strength .   Sensory: normal.   Coordination: n/ a Gait and station: Patient could rise unassisted from a seated position, walked without assistive device.  Deep tendon reflexes: in the  upper and lower  extremities are symmetric and intact.      ASSESSMENT AND PLAN 33 y.o. year old male  here with:    1) Patient with OSA and compliant with CPAP, feels good when using CPAP.  Sleep quality has improved also-  less frequent PTSD. Epworth score and FSS are much decreased since he uses CPAP.   2) He lost 70 pounds since initial sleep evaluation and  needs less than 10 cm wtaer pressure on CPAP.   3) plan : adjusting the CPAP  pressure range to 7- 15  cm water.  Set up in 2022 when the patient was 320 pounds.  Before we renew a CPAP prescription in 2 years, will need a HST to see how much lower the baseline AHI  has gotten since weight loss.   BMI is 33 , was 38 before.   I would like to thank Jonathon Neighbors, MD and Jonathon Neighbors, Md [address] [place],  [place] [address] for allowing me to meet with and to take care of this pleasant patient.     After spending a total time of  30  minutes face to face and additional time for physical and neurologic examination, review of laboratory studies,  personal review of imaging studies, reports and results of other testing and review of referral information / records as far as provided in visit,   Electronically signed by: Neomia Banner, MD 12/27/2023 3:20 PM  [facility] Board certified by The [employer] of Sleep Medicine and Diplomate of the [employer] of Sleep Medicine. Board certified In Neurology through the ABPN, Fellow of the [employer] of Neurology.

## 2023-12-27 NOTE — Patient Instructions (Addendum)
 ASSESSMENT AND PLAN:   33 y.o. year old male here with:  1) Patient with OSA and compliant with CPAP, feels good when using CPAP.  Sleep quality has improved also-  less frequent PTSD. Epworth score and FSS are much decreased since he uses CPAP.   2) He lost 70 pounds since initial sleep evaluation and  needs less than 10 cm wtaer pressure on CPAP.   3) plan : adjusting the CPAP  pressure range to 7- 15 cm water.  Set up in 2022 when the patient was 320 pounds.  Before we renew a CPAP prescription in 2 years, will need a HST to see how much lower the baseline AHI  has gotten since weight loss.   BMI is 33 , was 38 before.    CPAP and BIPAP Information CPAP and BIPAP use air pressure to keep your airways open and help you breathe well. CPAP and BIPAP use different amounts of pressure. Your health care provider will tell you whether CPAP or BIPAP would be best for you. CPAP stands for continuous positive airway pressure. With CPAP, the amount of pressure stays the same while you breathe in and out. BIPAP stands for bi-level positive airway pressure. With BIPAP, the amount of pressure will be higher when you breathe in and lower when you breathe out. This allows you to take bigger breaths. CPAP or BIPAP may be used in the hospital or at home. You may need to have a sleep study before your provider can order a device for you to use at home. What are the advantages? CPAP and BIPAP are most often used for obstructive sleep apnea to keep the airways from collapsing when the muscles relax during sleep. CPAP or BIPAP can be used if you have: Chronic obstructive pulmonary disease. Heart failure. Medical conditions that cause muscle weakness. Other problems that cause breathing to be shallow, weak, or difficult. What are the risks? Your provider will talk with you about risks. These may include: Sores on your nose or face caused from the mask, prongs, or nasal pillows. Dry or stuffy nose or  nosebleeds. Feeling gassy or bloated. Sinus or lung infection if the equipment is not cleaned well. When should CPAP or BIPAP be used? In most cases, CPAP or BIPAP is used during sleep at night or whenever the main sleep time happens. It's also used during naps. People with some medical conditions may need to wear the mask when they're awake. Follow instructions from your provider about when to use your CPAP or BIPAP. What happens during CPAP or BIPAP?  Both CPAP and BIPAP use a small machine that uses electricity to create air pressure. A long tube connects the device to a plastic mask. Air is blown through the mask into your nose or mouth. The amount of pressure that's used to blow the air can be adjusted. Your provider will set the pressure setting and help you find the best mask for you. Tips for using the mask There are different types and sizes of masks. If your mask does not fit well, talk with your provider about getting a different one. Some common types of masks include: Full face masks, which fit over the mouth and nose. Nasal masks, which fit over the nose. Nasal pillow or prong masks, which fit into the nostrils. The mask needs to be snug to your face, so some people feel trapped or closed in at first. If you feel this way, you may need to get used  to the mask. Hold the mask loosely over your nose or mouth and then gradually put the the mask on more snugly. Slowly increase the amount of time you use the mask. If you have trouble with your mask not fitting well or leaking, talk with your provider. Do not stop using the mask. Tips for using the device Follow instructions from your provider about how to and how often to use the device. For home use, CPAP and BIPAP devices come from home health care companies. There are many different brands. Your health insurance company will help to decide which device you get. Keep the CPAP or BIPAP device and attachments clean. Ask your home health  care company or check the instruction book for cleaning instructions. Make sure the humidifier is filled with germ-free (sterile) water and is working correctly. This will help prevent a dry or stuffy nose or nosebleeds. A nasal saline mist or spray may keep your nose from getting dry and sore. Do not eat or drink while the CPAP or BIPAP device is on. Food or drinks could get pushed into your lungs by the pressure of the CPAP or BIPAP. Follow these instructions at home: Take over-the-counter and prescription medicines only as told by your provider. Do not smoke, vape, or use nicotine or tobacco. Contact a health care provider if: You have redness or pressure sores on your head, face, mouth, or nose from the mask or headgear. You have trouble using the CPAP or BIPAP device. You have trouble going to sleep or staying asleep. Someone tells you that you snore even when wearing your CPAP or BIPAP device. Get help right away if: You have trouble breathing. You feel confused. These symptoms may be an emergency. Get help right away. Call 911. Do not wait to see if the symptoms will go away. Do not drive yourself to the hospital. This information is not intended to replace advice given to you by your health care provider. Make sure you discuss any questions you have with your health care provider. Document Revised: 11/22/2022 Document Reviewed: 11/22/2022 Elsevier Patient Education  2024 [employer].

## 2024-01-16 ENCOUNTER — Encounter (HOSPITAL_COMMUNITY): Payer: Self-pay

## 2024-01-16 ENCOUNTER — Telehealth (HOSPITAL_BASED_OUTPATIENT_CLINIC_OR_DEPARTMENT_OTHER): Payer: Self-pay | Admitting: Psychiatry

## 2024-01-16 DIAGNOSIS — Z91199 Patient's noncompliance with other medical treatment and regimen due to unspecified reason: Secondary | ICD-10-CM

## 2024-01-16 NOTE — Progress Notes (Signed)
 Patient is a [email or web address] today on video platform.  Called and left a message to reschedule.

## 2024-04-29 ENCOUNTER — Other Ambulatory Visit (HOSPITAL_COMMUNITY): Payer: Self-pay | Admitting: Psychiatry

## 2024-04-29 DIAGNOSIS — F411 Generalized anxiety disorder: Secondary | ICD-10-CM

## 2024-04-29 DIAGNOSIS — F4310 Post-traumatic stress disorder, unspecified: Secondary | ICD-10-CM

## 2024-04-29 DIAGNOSIS — F410 Panic disorder [episodic paroxysmal anxiety] without agoraphobia: Secondary | ICD-10-CM

## 2024-06-25 ENCOUNTER — Encounter: Payer: Self-pay | Admitting: Neurology

## 2024-12-31 ENCOUNTER — Ambulatory Visit: Admitting: Adult Health
